# Patient Record
Sex: Female | Born: 1985 | Hispanic: Yes | Marital: Married | Smoking: Never smoker
Health system: Southern US, Community
[De-identification: ages and names within clinical notes are randomized; demographics above are authoritative.]

## PROBLEM LIST (undated history)

## (undated) DIAGNOSIS — E079 Disorder of thyroid, unspecified: Secondary | ICD-10-CM

## (undated) DIAGNOSIS — F32A Depression, unspecified: Secondary | ICD-10-CM

## (undated) DIAGNOSIS — O24419 Gestational diabetes mellitus in pregnancy, unspecified control: Secondary | ICD-10-CM

## (undated) DIAGNOSIS — F329 Major depressive disorder, single episode, unspecified: Secondary | ICD-10-CM

## (undated) DIAGNOSIS — E039 Hypothyroidism, unspecified: Secondary | ICD-10-CM

## (undated) DIAGNOSIS — F419 Anxiety disorder, unspecified: Secondary | ICD-10-CM

## (undated) DIAGNOSIS — N9489 Other specified conditions associated with female genital organs and menstrual cycle: Secondary | ICD-10-CM

## (undated) HISTORY — PX: THYROIDECTOMY: SHX17

---
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
## 2019-07-11 LAB — OB RESULTS CONSOLE HEPATITIS B SURFACE ANTIGEN: Hepatitis B Surface Ag: NEGATIVE

## 2019-07-11 LAB — OB RESULTS CONSOLE RUBELLA ANTIBODY, IGM: Rubella: IMMUNE

## 2019-07-14 ENCOUNTER — Emergency Department
Admission: EM | Admit: 2019-07-14 | Discharge: 2019-07-14 | Disposition: A | Discharge status: Home / Self Care | Payer: Medicaid Other | Attending: Emergency Medicine | Admitting: Emergency Medicine

## 2019-07-14 ENCOUNTER — Emergency Department: Payer: Medicaid Other

## 2019-07-14 ENCOUNTER — Other Ambulatory Visit: Payer: Self-pay

## 2019-07-14 ENCOUNTER — Encounter: Payer: Self-pay | Admitting: Emergency Medicine

## 2019-07-14 DIAGNOSIS — O200 Threatened abortion: Secondary | ICD-10-CM | POA: Diagnosis not present

## 2019-07-14 DIAGNOSIS — Z3A08 8 weeks gestation of pregnancy: Secondary | ICD-10-CM | POA: Insufficient documentation

## 2019-07-14 DIAGNOSIS — N760 Acute vaginitis: Secondary | ICD-10-CM | POA: Insufficient documentation

## 2019-07-14 DIAGNOSIS — B9689 Other specified bacterial agents as the cause of diseases classified elsewhere: Secondary | ICD-10-CM | POA: Diagnosis not present

## 2019-07-14 DIAGNOSIS — O26899 Other specified pregnancy related conditions, unspecified trimester: Secondary | ICD-10-CM

## 2019-07-14 DIAGNOSIS — O209 Hemorrhage in early pregnancy, unspecified: Secondary | ICD-10-CM | POA: Diagnosis present

## 2019-07-14 HISTORY — DX: Disorder of thyroid, unspecified: E07.9

## 2019-07-14 LAB — URINALYSIS, ROUTINE W REFLEX MICROSCOPIC
Bilirubin Urine: NEGATIVE
Glucose, UA: NEGATIVE mg/dL
Hgb urine dipstick: NEGATIVE
Ketones, ur: 20 mg/dL — AB
Leukocytes,Ua: NEGATIVE
Nitrite: NEGATIVE
Protein, ur: NEGATIVE mg/dL
Specific Gravity, Urine: 1.012 (ref 1.005–1.030)
pH: 6 (ref 5.0–8.0)

## 2019-07-14 LAB — WET PREP, GENITAL
Sperm: NONE SEEN
Trich, Wet Prep: NONE SEEN
Yeast Wet Prep HPF POC: NONE SEEN

## 2019-07-14 LAB — OB RESULTS CONSOLE GC/CHLAMYDIA: Gonorrhea: NEGATIVE

## 2019-07-14 LAB — CBC WITH DIFFERENTIAL/PLATELET
Abs Immature Granulocytes: 0.08 10*3/uL — ABNORMAL HIGH (ref 0.00–0.07)
Basophils Absolute: 0 10*3/uL (ref 0.0–0.1)
Basophils Relative: 0 %
Eosinophils Absolute: 0 10*3/uL (ref 0.0–0.5)
Eosinophils Relative: 0 %
HCT: 40.7 % (ref 36.0–46.0)
Hemoglobin: 14.3 g/dL (ref 12.0–15.0)
Immature Granulocytes: 1 %
Lymphocytes Relative: 15 %
Lymphs Abs: 2.2 10*3/uL (ref 0.7–4.0)
MCH: 28.8 pg (ref 26.0–34.0)
MCHC: 35.1 g/dL (ref 30.0–36.0)
MCV: 82.1 fL (ref 80.0–100.0)
Monocytes Absolute: 0.9 10*3/uL (ref 0.1–1.0)
Monocytes Relative: 6 %
Neutro Abs: 11.6 10*3/uL — ABNORMAL HIGH (ref 1.7–7.7)
Neutrophils Relative %: 78 %
Platelets: 281 10*3/uL (ref 150–400)
RBC: 4.96 MIL/uL (ref 3.87–5.11)
RDW: 13.1 % (ref 11.5–15.5)
WBC: 14.9 10*3/uL — ABNORMAL HIGH (ref 4.0–10.5)
nRBC: 0 % (ref 0.0–0.2)

## 2019-07-14 LAB — BASIC METABOLIC PANEL
Anion gap: 11 (ref 5–15)
BUN: 8 mg/dL (ref 6–20)
CO2: 22 mmol/L (ref 22–32)
Calcium: 9.7 mg/dL (ref 8.9–10.3)
Chloride: 102 mmol/L (ref 98–111)
Creatinine, Ser: 0.75 mg/dL (ref 0.44–1.00)
GFR calc Af Amer: >60 mL/min (ref >60)
GFR calc non Af Amer: >60 mL/min (ref >60)
Glucose, Bld: 95 mg/dL (ref 70–99)
Potassium: 3.5 mmol/L (ref 3.5–5.1)
Sodium: 135 mmol/L (ref 135–145)

## 2019-07-14 LAB — POCT PREGNANCY, URINE: Preg Test, Ur: POSITIVE — AB

## 2019-07-14 LAB — ABO/RH: ABO/RH(D): O POS

## 2019-07-14 LAB — HCG, QUANTITATIVE, PREGNANCY: hCG, Beta Chain, Quant, S: 151873 m[IU]/mL — ABNORMAL HIGH (ref <5)

## 2019-07-14 MED ORDER — METRONIDAZOLE 500 MG PO TABS: 500.0000 mg | ORAL_TABLET | Freq: Two times a day (BID) | ORAL | 0 refills | Status: AC

## 2019-07-14 MED ORDER — ACETAMINOPHEN 500 MG PO TABS
1000.0000 mg | ORAL_TABLET | Freq: Once | ORAL | Status: AC
  Administered 2019-07-14: 1000 mg via ORAL
  Filled 2019-07-14: qty 2

## 2019-07-14 NOTE — ED Notes (Signed)
Patient's discharge and follow up information reviewed with patient by ED nursing staff and patient given the opportunity to ask questions pertaining to ED visit and discharge plan of care. Patient advised that should symptoms not continue to improve, resolve entirely, or should new symptoms develop then a follow up visit with their PCP or a return visit to the ED may be warranted. Patient verbalized consent and understanding of discharge plan of care including potential need for further evaluation. Patient discharged in stable condition per attending ED physician on duty.   Instructions reviewed with pt via medical interpreter Claudia.

## 2019-07-14 NOTE — ED Triage Notes (Signed)
C/O lower back pain, blood in urine and lower abdominal cramping.  Patient states she is [redacted] weeks pregnant.  Has been seen through [facility] care.  EDC:02/14/2020.  G1 P0

## 2019-07-14 NOTE — Discharge Instructions (Addendum)
Your work-up was reassuring except for slightly elevated white count.  You were positive for bacterial vaginosis so we are starting you on an antibiotic for that.  Do not drink alcohol with this antibiotic.  If you develop worsening pain on your right or left side you should return to the ER.  At this time your fetus looks healthy on ultrasound.  Return to the ER for fevers, vomiting or any other concerns.   IMPRESSION: Normal-appearing single intrauterine pregnancy approximately 8 weeks 5 days gestation.

## 2019-07-14 NOTE — ED Provider Notes (Signed)
[facility] Emergency Department Provider Note  ____________________________________________   First MD Initiated Contact with Patient 07/14/19 1958     (approximate)  I have reviewed the triage vital signs and the nursing notes.   HISTORY  Chief Complaint Vaginal Bleeding    HPI Hannah Maldonado is a 33 y.o. female known pregnancy who presents with abdominal pain.  Patient states that she developed some lower abdominal cramping today.  Is mostly on the left lower quadrant.  The pain was moderate, intermittent, cramping sensation, has not taken anything to help the pain, nothing makes it worse.  Occasionally some pain in her bilateral back as well.  She states that she wiped and there was some blood on the toilet paper and a little bit of discharge so she was worried she might be having a miscarriage therefore presented to the ER.  She followed by [facility] for OB.  Denies any shortness of breath, cough, chest pain, upper abdominal pain.  No prior surgeries on her abdomen.  She has had some associated nausea but has been since pregnancy not new.          Past Medical History:  Diagnosis Date  . Thyroid disease     There are no active problems to display for this patient.   Past Surgical History:  Procedure Laterality Date  . THYROIDECTOMY      Prior to Admission medications   Not on File    Allergies Torecan [thiethylperazine]  No family history on file.  Social History Social History   Tobacco Use  . Smoking status: Never Smoker  . Smokeless tobacco: Never Used  Substance Use Topics  . Alcohol use: Not Currently  . Drug use: Never      Review of Systems Constitutional: No fever/chills Eyes: No visual changes. ENT: No sore throat. Cardiovascular: Denies chest pain. Respiratory: Denies shortness of breath. Gastrointestinal: Positive abdominal pain, nausea.  No diarrhea.  No constipation. Genitourinary: Negative for  dysuria.  Positive vaginal bleeding Musculoskeletal: Positive back pain Skin: Negative for rash. Neurological: Negative for headaches, focal weakness or numbness. All other ROS negative ____________________________________________   PHYSICAL EXAM:  VITAL SIGNS: ED Triage Vitals  Enc Vitals Group     BP 07/14/19 1732 114/84     Pulse Rate 07/14/19 1732 90     Resp 07/14/19 1732 16     Temp 07/14/19 1732 98.6 F (37 C)     Temp Source 07/14/19 1732 Oral     SpO2 07/14/19 1732 100 %     Weight 07/14/19 1733 149 lb (67.6 kg)     Height 07/14/19 1733 5\' 5"  (1.651 m)     Head Circumference --      Peak Flow --      Pain Score 07/14/19 1733 8     Pain Loc --      Pain Edu? --      Excl. in GC? --     Constitutional: Alert and oriented. Well appearing and in no acute distress. Eyes: Conjunctivae are normal. EOMI. Head: Atraumatic. Nose: No congestion/rhinnorhea. Mouth/Throat: Mucous membranes are moist.   Neck: No stridor. Trachea Midline. FROM Cardiovascular: Normal rate, regular rhythm. Grossly normal heart sounds.  Good peripheral circulation. Respiratory: Normal respiratory effort.  No retractions. Lungs CTAB. Gastrointestinal: Soft may be slight tenderness in the left lower quadrant and suprapubically.  No distention. No abdominal bruits.  No rebound.  No guarding. Musculoskeletal: No lower extremity tenderness nor edema.  No joint  effusions. Neurologic:  Normal speech and language. No gross focal neurologic deficits are appreciated.  Skin:  Skin is warm, dry and intact. No rash noted. Psychiatric: Mood and affect are normal. Speech and behavior are normal. GU: Deferred   ____________________________________________   LABS (all labs ordered are listed, but only abnormal results are displayed)  Labs Reviewed  URINALYSIS, ROUTINE W REFLEX MICROSCOPIC - Abnormal; Notable for the following components:      Result Value   Color, Urine YELLOW (*)    APPearance HAZY (*)     Ketones, ur 20 (*)    All other components within normal limits  CBC WITH DIFFERENTIAL/PLATELET - Abnormal; Notable for the following components:   WBC 14.9 (*)    Neutro Abs 11.6 (*)    Abs Immature Granulocytes 0.08 (*)    All other components within normal limits  HCG, QUANTITATIVE, PREGNANCY - Abnormal; Notable for the following components:   hCG, Beta Chain, Quant, S [id number] (*)    All other components within normal limits  POCT PREGNANCY, URINE - Abnormal; Notable for the following components:   Preg Test, Ur POSITIVE (*)    All other components within normal limits  BASIC METABOLIC PANEL  POC URINE PREG, ED  ABO/RH   ____________________________________________  RADIOLOGY   Official radiology report(s): US Ob Less Than 14 Weeks With Ob Transvaginal  Result Date: 07/14/2019 CLINICAL DATA:  Lower abdominal pain for 1 day. EXAM: OBSTETRIC <14 WK [place] AND TRANSVAGINAL OB US TECHNIQUE: Both transabdominal and transvaginal ultrasound examinations were performed for complete evaluation of the gestation as well as the maternal uterus, adnexal regions, and pelvic cul-de-sac. Transvaginal technique was performed to assess early pregnancy. COMPARISON:  None. FINDINGS: Intrauterine gestational sac: Single Yolk sac:  Yes Embryo:  Yes Cardiac Activity: Yes Heart Rate: 171 bpm CRL:  21.2 mm   8 w   5 d                  [place] EDC: 02/18/2020 Subchorionic hemorrhage:  None visualized. Maternal uterus/adnexae: 5.5 cm simple cyst in the right ovary. 1.6 cm corpus luteum cyst on the left ovary. IMPRESSION: Normal-appearing single intrauterine pregnancy approximately 8 weeks 5 days gestation. Electronically Signed   By: Francene Boyers M.D.   On: 07/14/2019 19:56    ____________________________________________   PROCEDURES  Procedure(s) performed (including Critical Care):  Procedures   ____________________________________________   INITIAL IMPRESSION / ASSESSMENT AND PLAN / ED COURSE   Hannah Maldonado was evaluated in Emergency Department on 07/14/2019 for the symptoms described in the history of present illness. She was evaluated in the context of the global COVID-19 pandemic, which necessitated consideration that the patient might be at risk for infection with the SARS-CoV-2 virus that causes COVID-19. Institutional protocols and algorithms that pertain to the evaluation of patients at risk for COVID-19 are in a state of rapid change based on information released by regulatory bodies including the CDC and federal and state organizations. These policies and algorithms were followed during the patient's care in the ED.    Patient is a very well-appearing 33 year old with her first pregnancy.  Will get ultrasound to evaluate for ectopic pregnancy.  Patient has no right lower quadrant pain to suggest appendicitis.  She says it seems be more located on the left side.  We did discuss return precautions in regards to this.  She has no blood in her urine and her back pain is not centered on one side to suggest kidney stones.  She is O+ she does not need RhoGam.  Will get urine to evaluate for UTI.  She denies new sexual contact.  Her husband is in [place].  We will send gonorrhea and Chlamydia testing but will hold off on treatment unless concerning pelvic exam.  Pregnancy test is positive with a hCG of 151,000.  UA without evidence of UTI.  White count slightly elevated at 14.9.  But again no other infectious symptoms and vitals are otherwise reassuring.  Kidney function is normal.  Ultrasound shows normal-appearing single intrauterine pregnancy at 8 weeks 5 days gestation.  Pelvic exam with the minimal amount of discharge.  No cervical motion tenderness.  No obvious blood.  Patient's wet prep is positive for clue cells.  Will start on a course of Flagyl for bacterial vaginosis given the discharge and some lower abdominal pain.  I discussed the provisional nature of ED diagnosis, the treatment  so far, the ongoing plan of care, follow up appointments and return precautions with the patient and any family or support people present. They expressed understanding and agreed with the plan, discharged home.       ____________________________________________   FINAL CLINICAL IMPRESSION(S) / ED DIAGNOSES   Final diagnoses:  Threatened miscarriage  BV (bacterial vaginosis)      MEDICATIONS GIVEN DURING THIS VISIT:  Medications  acetaminophen (TYLENOL) tablet 1,000 mg (1,000 mg Oral Given 07/14/19 2034)     ED Discharge Orders         Ordered    metroNIDAZOLE (FLAGYL) 500 MG tablet  2 times daily     07/14/19 2119           Note:  This document was prepared using Dragon voice recognition software and may include unintentional dictation errors.   Vanessa [place], MD 07/14/19 2121

## 2019-07-18 LAB — GC/CHLAMYDIA PROBE AMP
Chlamydia trachomatis, NAA: NEGATIVE
Neisseria Gonorrhoeae by PCR: NEGATIVE

## 2019-08-22 NOTE — L&D Delivery Note (Signed)
Delivery Note  First Stage: Labor onset: 2230 Induction: cytotec, cook catheter, Pitocin, AROM Analgesia /Anesthesia intrapartum: stadol, epidural AROM at 0137  Second Stage: Complete dilation at 0415 Onset of pushing at 0426 FHR second stage Cat II  Delivery of a viable female 02/27/20 at  0512 by CNM delivery of fetal head in LOA position with restitution to LOT. Tight nuchal cord x 1;  Anterior then posterior shoulders delivered easily with gentle downward traction. Baby placed on mom's chest, and attended to by peds.  Cord double clamped after cessation of pulsation, cut by CNM Cord blood sample collected   Third Stage: Placenta delivered spontaneously intact with 3VC @ 0516 Placenta disposition: routine disposal Uterine tone firm / bleeding scant  2nd deg laceration identified  Anesthesia for repair: epidural Repair: 2-0 Vicryl Ct-1, 3-0 Vicryl SH Est. Blood Loss (mL): 300  Complications: none  Mom to postpartum.  Baby to Couplet care / Skin to Skin.  Newborn: Birth Weight: pending  Apgar Scores: 8/9 Feeding planned: breast

## 2019-11-04 ENCOUNTER — Other Ambulatory Visit: Payer: Self-pay | Admitting: Physician Assistant

## 2019-11-04 DIAGNOSIS — Z3490 Encounter for supervision of normal pregnancy, unspecified, unspecified trimester: Secondary | ICD-10-CM

## 2019-11-10 ENCOUNTER — Ambulatory Visit
Admission: RE | Admit: 2019-11-10 | Discharge: 2019-11-10 | Disposition: A | Discharge status: Home / Self Care | Payer: Medicaid Other | Source: Ambulatory Visit | Attending: Physician Assistant | Admitting: Physician Assistant

## 2019-11-10 ENCOUNTER — Other Ambulatory Visit: Payer: Self-pay

## 2019-11-10 DIAGNOSIS — Z3490 Encounter for supervision of normal pregnancy, unspecified, unspecified trimester: Secondary | ICD-10-CM | POA: Insufficient documentation

## 2019-11-26 LAB — OB RESULTS CONSOLE RPR: RPR: NONREACTIVE

## 2019-11-26 LAB — OB RESULTS CONSOLE HIV ANTIBODY (ROUTINE TESTING): HIV: NONREACTIVE

## 2020-01-23 LAB — OB RESULTS CONSOLE GC/CHLAMYDIA
Chlamydia: NEGATIVE
Gonorrhea: NEGATIVE

## 2020-01-24 LAB — OB RESULTS CONSOLE GBS: GBS: POSITIVE

## 2020-02-17 ENCOUNTER — Encounter: Payer: Self-pay | Admitting: Certified Nurse Midwife

## 2020-02-17 ENCOUNTER — Other Ambulatory Visit: Payer: Self-pay | Admitting: Certified Nurse Midwife

## 2020-02-17 NOTE — Progress Notes (Signed)
  Malene Blaydes is a 34 y.o. G1P0 female dated by LMP c/w [redacted]w[redacted]d ultrasound.    Pregnancy Issues: 1. Postsurgical hypothyroidism, currently taking daily, advised to decrease to daily postpartum per [facility] Endocrinology 2. History of anxiety and depression, on Paxil prior to pregnancy  [facility]  [facility]   Prenatal Labs: Blood type/Rh O+  Antibody screen neg  Rubella Immune  Varicella pending  RPR NR  HBsAg Neg  HIV NR  GC neg  Chlamydia neg  Genetic screening Not done  1 hour GTT 120  3 hour GTT n/a  GBS positive    Post Partum Planning: - Infant feeding: breast - Contraception: abstinence (husband in [place])  Tdap: 11/25/2019 Flu: 06/21/2019

## 2020-02-26 ENCOUNTER — Inpatient Hospital Stay: Payer: Medicaid Other | Admitting: Anesthesiology

## 2020-02-26 ENCOUNTER — Inpatient Hospital Stay
Admission: EM | Admit: 2020-02-26 | Discharge: 2020-02-28 | DRG: 807 | Disposition: A | Discharge status: Home / Self Care | Payer: Medicaid Other | Attending: Obstetrics and Gynecology | Admitting: Obstetrics and Gynecology

## 2020-02-26 ENCOUNTER — Other Ambulatory Visit: Payer: Self-pay

## 2020-02-26 ENCOUNTER — Encounter: Payer: Self-pay | Admitting: Obstetrics and Gynecology

## 2020-02-26 DIAGNOSIS — Z3A41 41 weeks gestation of pregnancy: Secondary | ICD-10-CM

## 2020-02-26 DIAGNOSIS — O480 Post-term pregnancy: Secondary | ICD-10-CM | POA: Diagnosis present

## 2020-02-26 DIAGNOSIS — E890 Postprocedural hypothyroidism: Secondary | ICD-10-CM | POA: Diagnosis present

## 2020-02-26 DIAGNOSIS — Z20822 Contact with and (suspected) exposure to covid-19: Secondary | ICD-10-CM | POA: Diagnosis present

## 2020-02-26 DIAGNOSIS — O99284 Endocrine, nutritional and metabolic diseases complicating childbirth: Secondary | ICD-10-CM | POA: Diagnosis present

## 2020-02-26 HISTORY — DX: Depression, unspecified: F32.A

## 2020-02-26 HISTORY — DX: Anxiety disorder, unspecified: F41.9

## 2020-02-26 HISTORY — DX: Hypothyroidism, unspecified: E03.9

## 2020-02-26 LAB — CBC
HCT: 40 % (ref 36.0–46.0)
Hemoglobin: 13.8 g/dL (ref 12.0–15.0)
MCH: 30.9 pg (ref 26.0–34.0)
MCHC: 34.5 g/dL (ref 30.0–36.0)
MCV: 89.7 fL (ref 80.0–100.0)
Platelets: 189 10*3/uL (ref 150–400)
RBC: 4.46 MIL/uL (ref 3.87–5.11)
RDW: 13.3 % (ref 11.5–15.5)
WBC: 8.5 10*3/uL (ref 4.0–10.5)
nRBC: 0 % (ref 0.0–0.2)

## 2020-02-26 LAB — TYPE AND SCREEN
ABO/RH(D): O POS
Antibody Screen: NEGATIVE

## 2020-02-26 LAB — RPR: RPR Ser Ql: NONREACTIVE

## 2020-02-26 LAB — SARS CORONAVIRUS 2 BY RT PCR (HOSPITAL ORDER, PERFORMED IN ~~LOC~~ HOSPITAL LAB): SARS Coronavirus 2: NEGATIVE

## 2020-02-26 MED ORDER — OXYTOCIN-SODIUM CHLORIDE 30-0.9 UT/500ML-% IV SOLN
1.0000 m[IU]/min | INTRAVENOUS | Status: DC
  Administered 2020-02-26: 2 m[IU]/min via INTRAVENOUS
  Filled 2020-02-26 (×2): qty 500

## 2020-02-26 MED ORDER — LACTATED RINGERS IV SOLN: 500.0000 mL | INTRAVENOUS | Status: DC | PRN

## 2020-02-26 MED ORDER — SODIUM CHLORIDE 0.9 % IV SOLN
5.0000 10*6.[IU] | Freq: Once | INTRAVENOUS | Status: AC
  Administered 2020-02-26: 5 10*6.[IU] via INTRAVENOUS
  Filled 2020-02-26: qty 5

## 2020-02-26 MED ORDER — MISOPROSTOL 25 MCG QUARTER TABLET
25.0000 ug | ORAL_TABLET | ORAL | Status: DC | PRN
  Administered 2020-02-26: 25 ug via VAGINAL
  Filled 2020-02-26 (×2): qty 1

## 2020-02-26 MED ORDER — BUTORPHANOL TARTRATE 1 MG/ML IJ SOLN
1.0000 mg | INTRAMUSCULAR | Status: DC | PRN
  Administered 2020-02-26: 1 mg via INTRAVENOUS
  Filled 2020-02-26: qty 1

## 2020-02-26 MED ORDER — SOD CITRATE-CITRIC ACID 500-334 MG/5ML PO SOLN: 30.0000 mL | ORAL | Status: DC | PRN

## 2020-02-26 MED ORDER — TERBUTALINE SULFATE 1 MG/ML IJ SOLN: 0.2500 mg | Freq: Once | INTRAMUSCULAR | Status: DC | PRN

## 2020-02-26 MED ORDER — LIDOCAINE-EPINEPHRINE (PF) 1.5 %-1:200000 IJ SOLN
INTRAMUSCULAR | Status: DC | PRN
  Administered 2020-02-26: 3 mL via EPIDURAL

## 2020-02-26 MED ORDER — LACTATED RINGERS IV SOLN: INTRAVENOUS | Status: DC

## 2020-02-26 MED ORDER — OXYTOCIN-SODIUM CHLORIDE 30-0.9 UT/500ML-% IV SOLN
2.5000 [IU]/h | INTRAVENOUS | Status: DC
  Administered 2020-02-27: 2.5 [IU]/h via INTRAVENOUS

## 2020-02-26 MED ORDER — PENICILLIN G POT IN DEXTROSE 60000 UNIT/ML IV SOLN
3.0000 10*6.[IU] | INTRAVENOUS | Status: DC
  Administered 2020-02-26 – 2020-02-27 (×5): 3 10*6.[IU] via INTRAVENOUS
  Filled 2020-02-26 (×5): qty 50

## 2020-02-26 MED ORDER — ACETAMINOPHEN 325 MG PO TABS: 650.0000 mg | ORAL_TABLET | ORAL | Status: DC | PRN

## 2020-02-26 MED ORDER — AMMONIA AROMATIC IN INHA
RESPIRATORY_TRACT | Status: AC
  Filled 2020-02-26: qty 10

## 2020-02-26 MED ORDER — ONDANSETRON HCL 4 MG/2ML IJ SOLN
4.0000 mg | Freq: Four times a day (QID) | INTRAMUSCULAR | Status: DC | PRN
  Administered 2020-02-26: 4 mg via INTRAVENOUS
  Filled 2020-02-26: qty 2

## 2020-02-26 MED ORDER — LIDOCAINE HCL (PF) 1 % IJ SOLN
INTRAMUSCULAR | Status: DC | PRN
  Administered 2020-02-26: 3 mL via SUBCUTANEOUS

## 2020-02-26 MED ORDER — MISOPROSTOL 25 MCG QUARTER TABLET
25.0000 ug | ORAL_TABLET | ORAL | Status: DC | PRN
  Administered 2020-02-26 (×2): 25 ug via BUCCAL
  Filled 2020-02-26 (×2): qty 1

## 2020-02-26 MED ORDER — FENTANYL 2.5 MCG/ML W/ROPIVACAINE 0.15% IN NS 100 ML EPIDURAL (ARMC)
EPIDURAL | Status: DC | PRN
  Administered 2020-02-26: 12 mL/h via EPIDURAL

## 2020-02-26 MED ORDER — MISOPROSTOL 200 MCG PO TABS
ORAL_TABLET | ORAL | Status: AC
  Filled 2020-02-26: qty 4

## 2020-02-26 MED ORDER — LIDOCAINE HCL (PF) 1 % IJ SOLN
30.0000 mL | INTRAMUSCULAR | Status: DC | PRN
  Filled 2020-02-26: qty 30

## 2020-02-26 MED ORDER — FENTANYL 2.5 MCG/ML W/ROPIVACAINE 0.15% IN NS 100 ML EPIDURAL (ARMC)
EPIDURAL | Status: AC
  Filled 2020-02-26: qty 100

## 2020-02-26 MED ORDER — OXYTOCIN 10 UNIT/ML IJ SOLN
INTRAMUSCULAR | Status: AC
  Filled 2020-02-26: qty 2

## 2020-02-26 MED ORDER — OXYTOCIN BOLUS FROM INFUSION
333.0000 mL | Freq: Once | INTRAVENOUS | Status: AC
  Administered 2020-02-27: 333 mL via INTRAVENOUS

## 2020-02-26 NOTE — Progress Notes (Signed)
Labor Progress Note  Hannah Maldonado is a 34 y.o. G1P0 at [redacted]w[redacted]d by LMP admitted for induction of labor due to Post dates. Due date: 02/17/20.  Subjective: feeling more painful UCs and cook cath fell out while up to bathroom.   Objective: BP (!) 110/56 (BP Location: Left Arm)   Pulse 76   Temp 98.1 F (36.7 C) (Oral)   Resp 18   Ht 5\' 2"  (1.575 m)   Wt 75.3 kg   LMP 05/13/2019   BMI 30.36 kg/m  Notable VS details: reviewed  Fetal Assessment: FHT:  FHR: 140 bpm, variability: moderate,  accelerations:  Present,  decelerations:  Absent Category/reactivity:  Category I UC:   regular, every 2-3 minutes, Pitocin at 43mu/min SVE:  6/90/-2 BBOW  Membrane status: intact Amniotic color: n/a  Labs: Lab Results  Component Value Date   WBC 8.5 02/26/2020   HGB 13.8 02/26/2020   HCT 40.0 02/26/2020   MCV 89.7 02/26/2020   PLT 189 02/26/2020    Assessment / Plan: IOL at 41.2wks for late term.   Labor: s/p 2 doses of cytotec, and Cook cath placed, on Pitocin.  Preeclampsia:  no e/o pre-e Fetal Wellbeing:  Category I Pain Control:  s/p stadol, now requesting epidural.  I/D:  GBS prophy with PCN, adequately tx.  Anticipated MOD:  NSVD  04/28/2020, CNM 02/26/2020, 11:04 PM

## 2020-02-26 NOTE — Anesthesia Preprocedure Evaluation (Signed)
Anesthesia Evaluation  Patient identified by MRN, date of birth, ID band Patient awake    Reviewed: Allergy & Precautions, H&P , NPO status , Patient's Chart, lab work & pertinent test results  History of Anesthesia Complications Negative for: history of anesthetic complications  Airway Mallampati: II       Dental no notable dental hx.    Pulmonary    Pulmonary exam normal        Cardiovascular Normal cardiovascular exam     Neuro/Psych    GI/Hepatic   Endo/Other  Hypothyroidism   Renal/GU      Musculoskeletal   Abdominal   Peds  Hematology   Anesthesia Other Findings   Reproductive/Obstetrics (+) Pregnancy                             Anesthesia Physical Anesthesia Plan  ASA: II  Anesthesia Plan: Epidural   Post-op Pain Management:    Induction:   PONV Risk Score and Plan:   Airway Management Planned:   Additional Equipment:   Intra-op Plan:   Post-operative Plan:   Informed Consent: I have reviewed the patients History and Physical, chart, labs and discussed the procedure including the risks, benefits and alternatives for the proposed anesthesia with the patient or authorized representative who has indicated his/her understanding and acceptance.       Plan Discussed with:   Anesthesia Plan Comments:         Anesthesia Quick Evaluation

## 2020-02-26 NOTE — Progress Notes (Signed)
Labor Progress Note  Hannah Maldonado is a 34 y.o. G1P0 at [redacted]w[redacted]d by LMP admitted for induction of labor due to Post dates. Due date: 02/17/20.  Subjective: feeling more painful UCs  Objective: BP 112/63 (BP Location: Left Arm)   Pulse 70   Temp 97.9 F (36.6 C) (Oral)   Resp 15   Ht 5\' 2"  (1.575 m)   Wt 75.3 kg   LMP 05/13/2019   BMI 30.36 kg/m  Notable VS details: reviewed  Fetal Assessment: FHT:  FHR: 135 bpm, variability: moderate,  accelerations:  Present,  decelerations:  Absent Category/reactivity:  Category I UC:   regular, every 2-4 minutes SVE:   1.5/75/-2, soft/anterior.  Cook catheter placed, uterine balloon 66ml/vaginal balloon 2ml  Membrane status: intact Amniotic color: n/a  Labs: Lab Results  Component Value Date   WBC 8.5 02/26/2020   HGB 13.8 02/26/2020   HCT 40.0 02/26/2020   MCV 89.7 02/26/2020   PLT 189 02/26/2020    Assessment / Plan: IOL at 41.2wks for late term.   Labor: s/p 2 doses of cytotec, now Winters cath placed, will start Pitocin.  Preeclampsia:  no e/o pre-e Fetal Wellbeing:  Category I Pain Control:  Labor support without medications I/D:  GBS prophy with PCN, starting 4th dose now.  Anticipated MOD:  NSVD  Luceni, CNM 02/26/2020, 6:12 PM

## 2020-02-26 NOTE — Anesthesia Procedure Notes (Signed)
Epidural Patient location during procedure: OB Start time: 02/26/2020 11:23 AM End time: 02/26/2020 11:26 PM  Staffing Anesthesiologist: Naomie Dean, MD Performed: anesthesiologist   Preanesthetic Checklist Completed: patient identified, IV checked, site marked, risks and benefits discussed, surgical consent, monitors and equipment checked, pre-op evaluation and timeout performed  Epidural Patient position: sitting Prep: ChloraPrep Patient monitoring: heart rate, continuous pulse ox and blood pressure Approach: midline Location: L3-L4 Injection technique: LOR saline  Needle:  Needle type: Tuohy  Needle gauge: 17 G Needle length: 9 cm and 9 Needle insertion depth: 7 cm Catheter type: closed end flexible Catheter size: 19 Gauge Catheter at skin depth: 13 cm Test dose: negative and 1.5% lidocaine with Epi 1:200 K  Assessment Sensory level: T10 Events: blood not aspirated, injection not painful, no injection resistance, no paresthesia and negative IV test  Additional Notes Pt's history reviewed and consent obtained as per OB consent Patient tolerated the insertion well without complications. Negative SATD, negative IVTD All VSS were obtained and monitored through OBIX and nursing protocols followed.Reason for block:procedure for pain

## 2020-02-26 NOTE — H&P (Signed)
OB History & Physical   History of Present Illness:  Chief Complaint: induction of labor  HPI:  Hannah Maldonado is a 35 y.o. G1P0 female at [redacted]w[redacted]d dated by LMP and c/w [place] at [redacted]w[redacted]d..  She presents to L&D for late term IOL.   On arrival reported active FM, has had onset of ctx since Cytotec dose given at 0628 this am. Denies SROM or bloody show.   Pregnancy Issues: 1. Post-surgical hypothyroidism, currently taking daily, advised to decrease to daily postpartum per [facility] Endocrinology 2. History of anxiety and depression, on Paxil prior to pregnancy   Maternal Medical History:   Past Medical History:  Diagnosis Date  . Anxiety   . Depression   . Hypothyroidism   . Thyroid disease     Past Surgical History:  Procedure Laterality Date  . THYROIDECTOMY      Allergies  Allergen Reactions  . Torecan [Thiethylperazine] Other (See Comments)    Muscle spasms    Prior to Admission medications   Medication Sig Start Date End Date Taking? Authorizing Provider  levothyroxine (SYNTHROID) 150 MCG tablet Take 150 mcg by mouth daily before breakfast.   Yes [provider]  Prenatal Vit-Fe Fumarate-FA (PRENATAL MULTIVITAMIN) TABS tablet Take 1 tablet by mouth daily at 12 noon.   Yes [provider]     [facility] care site: [facility]  Social History: She  reports that she has never smoked. She has never used smokeless tobacco. She reports previous alcohol use. She reports that she does not use drugs.  Family History: no family hx Gyn cancers  Review of Systems: A full review of systems was performed and negative except as noted in the HPI.     Physical Exam:  Vital Signs: BP 111/76 (BP Location: Right Arm)   Pulse 76   Temp 97.9 F (36.6 C) (Oral)   Resp 16   Ht 5\' 2"  (1.575 m)   Wt 75.3 kg   LMP 05/13/2019   BMI 30.36 kg/m   General: no acute distress.  HEENT: normocephalic, atraumatic Heart: regular rate & rhythm.  No  murmurs/rubs/gallops Lungs: clear to auscultation bilaterally, normal respiratory effort Abdomen: soft, gravid, non-tender;  EFW: 7.5lbs Pelvic:   External: Normal external female genitalia  Cervix: Dilation: Fingertip / Effacement (%): Thick / Station: -3    Extremities: non-tender, symmetric, no edema bilaterally.  DTRs: 2+  Neurologic: Alert & oriented x 3.    Results for orders placed or performed during the hospital encounter of 02/26/20 (from the past 24 hour(s))  CBC     Status: None   Collection Time: 02/26/20  4:54 AM  Result Value Ref Range   WBC 8.5 4.0 - 10.5 K/uL   RBC 4.46 3.87 - 5.11 MIL/uL   Hemoglobin 13.8 12.0 - 15.0 g/dL   HCT 04/28/20 36 - 46 %   MCV 89.7 80.0 - 100.0 fL   MCH 30.9 26.0 - 34.0 pg   MCHC 34.5 30.0 - 36.0 g/dL   RDW 69.6 78.9 - 38.1 %   Platelets 189 150 - 400 K/uL   nRBC 0.0 0.0 - 0.2 %  Type and screen     Status: None   Collection Time: 02/26/20  4:54 AM  Result Value Ref Range   ABO/RH(D) O POS    Antibody Screen NEG    Sample Expiration      02/29/2020,2359 Performed at [facility], [address] [place], [place]   SARS Coronavirus  2 by RT PCR (hospital order, performed in [facility] hospital lab) Nasopharyngeal Nasopharyngeal Swab     Status: None   Collection Time: 02/26/20  5:23 AM   Specimen: Nasopharyngeal Swab  Result Value Ref Range   SARS Coronavirus 2 NEGATIVE NEGATIVE    Pertinent Results:  Prenatal Labs: Blood type/Rh O pos  Antibody screen neg  Rubella Immune  Varicella  pending  RPR NR  HBsAg Neg  HIV NR  GC neg  Chlamydia neg  Genetic screening  not done  1 hour GTT  120  3 hour GTT  n/a  GBS  Positive   FHT: 140bpm, mod variability, + accels, no decels TOCO: irreg UCs, palpate mild SVE:  Dilation: Fingertip / Effacement (%): Thick / Station: -3    Cephalic by leopolds  No results found.  Assessment:  Hannah Maldonado is a 33 y.o. G1P0 female at [redacted]w[redacted]d with Late term IOL.    Plan:  1. Admit to Labor & Delivery; consents reviewed and obtained - COVID neg on admit.   2. Fetal Well being  - Fetal Tracing: Cat I - Group B Streptococcus ppx indicated: Positive, PCN 1st dose given.  - Presentation: cephalic confirmed by Leopolds and bedside US  3. Routine OB: - Prenatal labs reviewed, as above - Rh O pos - CBC, T&S, RPR on admit - Clear fluids, IVF  4. Induction of Labor -  Contractions: external toco in place -  Pelvis unproven, adequate -  Plan for induction with cytotec -  Plan for continuous fetal monitoring  -  Maternal pain control as desired - Anticipate vaginal delivery  5. Post Partum Planning: - Infant feeding: breast - Contraception: abstinence (husband in [place]) Tdap: 11/25/2019 Flu: 06/21/2019  Prudencio Pair Charisse Wendell, CNM 02/26/20 8:13 AM

## 2020-02-27 ENCOUNTER — Encounter: Payer: Self-pay | Admitting: Obstetrics and Gynecology

## 2020-02-27 LAB — VARICELLA ZOSTER ANTIBODY, IGG: Varicella IgG: <135 index — ABNORMAL LOW (ref >165)

## 2020-02-27 MED ORDER — PRENATAL MULTIVITAMIN CH
1.0000 | ORAL_TABLET | Freq: Every day | ORAL | Status: DC
  Administered 2020-02-27 – 2020-02-28 (×2): 1 via ORAL
  Filled 2020-02-27 (×2): qty 1

## 2020-02-27 MED ORDER — IBUPROFEN 600 MG PO TABS
600.0000 mg | ORAL_TABLET | Freq: Four times a day (QID) | ORAL | Status: DC
  Administered 2020-02-27: 600 mg via ORAL
  Filled 2020-02-27: qty 1

## 2020-02-27 MED ORDER — ONDANSETRON HCL 4 MG/2ML IJ SOLN: 4.0000 mg | INTRAMUSCULAR | Status: DC | PRN

## 2020-02-27 MED ORDER — PHENYLEPHRINE 40 MCG/ML (10ML) SYRINGE FOR IV PUSH (FOR BLOOD PRESSURE SUPPORT): 80.0000 ug | PREFILLED_SYRINGE | INTRAVENOUS | Status: DC | PRN

## 2020-02-27 MED ORDER — FENTANYL 2.5 MCG/ML W/ROPIVACAINE 0.15% IN NS 100 ML EPIDURAL (ARMC): 12.0000 mL/h | EPIDURAL | Status: DC

## 2020-02-27 MED ORDER — DIPHENHYDRAMINE HCL 25 MG PO CAPS: 25.0000 mg | ORAL_CAPSULE | Freq: Four times a day (QID) | ORAL | Status: DC | PRN

## 2020-02-27 MED ORDER — EPHEDRINE 5 MG/ML INJ
INTRAVENOUS | Status: AC
  Filled 2020-02-27: qty 4

## 2020-02-27 MED ORDER — LACTATED RINGERS IV SOLN
500.0000 mL | Freq: Once | INTRAVENOUS | Status: AC
  Administered 2020-02-26: 500 mL via INTRAVENOUS

## 2020-02-27 MED ORDER — ONDANSETRON HCL 4 MG PO TABS: 4.0000 mg | ORAL_TABLET | ORAL | Status: DC | PRN

## 2020-02-27 MED ORDER — COCONUT OIL OIL
1.0000 "application " | TOPICAL_OIL | Status: DC | PRN
  Administered 2020-02-27 – 2020-02-28 (×2): 1 via TOPICAL
  Filled 2020-02-27 (×2): qty 120

## 2020-02-27 MED ORDER — BENZOCAINE-MENTHOL 20-0.5 % EX AERO
1.0000 "application " | INHALATION_SPRAY | CUTANEOUS | Status: DC | PRN
  Administered 2020-02-28: 1 via TOPICAL
  Filled 2020-02-27 (×2): qty 56

## 2020-02-27 MED ORDER — ZOLPIDEM TARTRATE 5 MG PO TABS: 5.0000 mg | ORAL_TABLET | Freq: Every evening | ORAL | Status: DC | PRN

## 2020-02-27 MED ORDER — ACETAMINOPHEN 325 MG PO TABS
650.0000 mg | ORAL_TABLET | ORAL | Status: DC | PRN
  Administered 2020-02-27 – 2020-02-28 (×6): 650 mg via ORAL
  Filled 2020-02-27 (×6): qty 2

## 2020-02-27 MED ORDER — EPHEDRINE 5 MG/ML INJ
10.0000 mg | INTRAVENOUS | Status: DC | PRN
  Administered 2020-02-27: 10 mg via INTRAVENOUS

## 2020-02-27 MED ORDER — SENNOSIDES-DOCUSATE SODIUM 8.6-50 MG PO TABS
2.0000 | ORAL_TABLET | ORAL | Status: DC
  Administered 2020-02-28: 2 via ORAL
  Filled 2020-02-27: qty 2

## 2020-02-27 MED ORDER — DIBUCAINE (PERIANAL) 1 % EX OINT: 1.0000 "application " | TOPICAL_OINTMENT | CUTANEOUS | Status: DC | PRN

## 2020-02-27 MED ORDER — LEVOTHYROXINE SODIUM 125 MCG PO TABS
125.0000 ug | ORAL_TABLET | Freq: Every day | ORAL | Status: DC
  Administered 2020-02-27 – 2020-02-28 (×2): 125 ug via ORAL
  Filled 2020-02-27: qty 1

## 2020-02-27 MED ORDER — SIMETHICONE 80 MG PO CHEW: 80.0000 mg | CHEWABLE_TABLET | ORAL | Status: DC | PRN

## 2020-02-27 MED ORDER — DIPHENHYDRAMINE HCL 50 MG/ML IJ SOLN: 12.5000 mg | INTRAMUSCULAR | Status: DC | PRN

## 2020-02-27 MED ORDER — IBUPROFEN 600 MG PO TABS
600.0000 mg | ORAL_TABLET | Freq: Four times a day (QID) | ORAL | Status: DC
  Administered 2020-02-27 – 2020-02-28 (×4): 600 mg via ORAL
  Filled 2020-02-27 (×4): qty 1

## 2020-02-27 MED ORDER — LEVOTHYROXINE SODIUM 150 MCG PO TABS
150.0000 ug | ORAL_TABLET | Freq: Every day | ORAL | Status: DC
  Filled 2020-02-27: qty 1

## 2020-02-27 MED ORDER — WITCH HAZEL-GLYCERIN EX PADS
1.0000 "application " | MEDICATED_PAD | CUTANEOUS | Status: DC | PRN
  Administered 2020-02-28: 1 via TOPICAL
  Filled 2020-02-27 (×2): qty 100

## 2020-02-27 NOTE — Progress Notes (Signed)
Post Partum Day 0  Subjective: no complaints, up ad lib, voiding, tolerating PO and + flatus  Doing well, no concerns. Ambulating without difficulty, pain managed with PO meds, tolerating regular diet, and voiding without difficulty.   No fever/chills, chest pain, shortness of breath, nausea/vomiting, or leg pain. No nipple or breast pain. No headache, visual changes, or RUQ/epigastric pain.  Objective: BP 103/60 (BP Location: Left Arm)   Pulse 76   Temp 97.8 F (36.6 C) (Oral)   Resp 18   Ht 5\' 2"  (1.575 m)   Wt 75.3 kg   LMP 05/13/2019   SpO2 99% Comment: Room Air  Breastfeeding Unknown   BMI 30.36 kg/m    Physical Exam:  General: alert, cooperative and no distress Breasts: soft/nontender CV: RRR Pulm: nl effort, CTABL Abdomen: soft, non-tender, active bowel sounds Uterine Fundus: firm Perineum: minimal edema, repair well approximated Lochia: appropriate DVT Evaluation: No evidence of DVT seen on physical exam.  Recent Labs    02/26/20 0454  HGB 13.8  HCT 40.0  WBC 8.5  PLT 189    Assessment/Plan: 34 y.o. G1P1001 postpartum day # 0  -Continue routine postpartum care -Lactation consult PRN for breastfeeding   -Discussed contraceptive options including implant, IUDs hormonal and non-hormonal, injection, pills/ring/patch, condoms, and NFP.  -CBC in AM -Immunization status: all immunizations up to date  Disposition: Continue inpatient postpartum care    LOS: 1 day   34, CNM 02/27/2020, 6:39 PM   ----- 04/29/2020  Certified Nurse Midwife [facility]

## 2020-02-27 NOTE — Discharge Summary (Signed)
Obstetrical Discharge Summary  Patient Name: Hannah Maldonado DOB: 1985-10-06 MRN: [id number]  Date of Admission: 02/26/2020 Date of Delivery: 02/27/20 Delivered by: Heloise Ochoa CNM Date of Discharge: 02/28/2020  Primary OB: Phineas Real  [id number] last menstrual period was 05/13/2019. EDC Estimated Date of Delivery: 02/17/20 Gestational Age at Delivery: [redacted]w[redacted]d   Antepartum complications: 1.Post-surgical hypothyroidism, currently taking daily, advised to decrease to daily postpartum per [facility] Endocr[facility] 2. History of anxiety and depression, on Paxil prior to pregnancy  Admitting Diagnosis: late term pregnancy Secondary Diagnosis: SVD, nuchal cord, meconium stained fluid, 2nd deg laceration  Patient Active Problem List   Diagnosis Date Noted  . Labor and delivery indication for care or intervention 02/26/2020    Augmentation: AROM, Pitocin, Cytotec and IP Foley Complications: None Intrapartum complications/course: see delivery note Date of Delivery: 02/27/20 Delivered By: Heloise Ochoa CNM Delivery Type: spontaneous vaginal delivery Anesthesia: epidural Placenta: spontaneous Laceration: 2nd deg laceration with repair, periclitoral Episiotomy: none Newborn Data: Live born female  Birth Weight:   APGAR: 8,   Newborn Delivery   Birth date/time: 02/27/2020 05:12:00 Delivery type: Vaginal, Spontaneous      Postpartum Procedures: none  Edinburgh:  Edinburgh Postnatal Depression Scale Screening Tool 02/27/2020 02/27/2020  I have been able to laugh and see the funny side of things. 0 (No Data)  I have looked forward with enjoyment to things. 0 -  I have blamed myself unnecessarily when things went wrong. 0 -  I have been anxious or worried for no good reason. 0 -  I have felt scared or panicky for no good reason. 0 -  Things have been getting on top of me. 1 -  I have been so unhappy that I have had difficulty sleeping. 0 -  I have felt sad or miserable.  0 -  I have been so unhappy that I have been crying. 0 -  The thought of harming myself has occurred to me. 0 -  Edinburgh Postnatal Depression Scale Total 1 -      Post partum course:  Patient had an uncomplicated postpartum course.  By time of discharge on PPD#1, her pain was controlled on oral pain medications; she had appropriate lochia and was ambulating, voiding without difficulty and tolerating regular diet.  She was deemed stable for discharge to home.     Discharge Physical Exam:  BP 104/76 (BP Location: Left Arm)   Pulse 79   Temp 98.1 F (36.7 C) (Oral)   Resp 18   Ht 5\' 2"  (1.575 m)   Wt 75.3 kg   LMP 05/13/2019   SpO2 99%   Breastfeeding Unknown   BMI 30.36 kg/m   General: NAD CV: RRR Pulm: CTABL, nl effort ABD: s/nd/nt, fundus firm and below the umbilicus Lochia: moderate Perineum: minimal edema, repair well approximated DVT Evaluation: LE non-ttp, no evidence of DVT on exam.  Hemoglobin  Date Value Ref Range Status  02/28/2020 11.5 (L) 12.0 - 15.0 g/dL Final   HCT  Date Value Ref Range Status  02/28/2020 32.6 (L) 36 - 46 % Final     Disposition: stable, discharge to home. Baby Feeding: breastmilk Baby Disposition: home with mom  Rh Immune globulin given: n/a Rubella vaccine given: immune Varicella vaccine given: needs prior to DC Tdap vaccine given in AP or PP setting: 11/25/19 Flu vaccine given in AP or PP setting: 06/21/19  Contraception: abstinence, spouse in 06/23/19  Prenatal Labs:  Blood type/Rh O pos  Antibody screen neg  Rubella  Immune  Varicella  NON-immune  RPR NR  HBsAg Neg  HIV NR  GC neg  Chlamydia neg  Genetic screening  not done  1 hour GTT  120  3 hour GTT  n/a  GBS  Positive      Plan:  Kiyoko Trujillo-Gonzalez was discharged to home in good condition. Follow-up appointment with delivering provider in 4 weeks.  Discharge Medications: Allergies as of 02/28/2020      Reactions   Torecan [thiethylperazine] Other  (See Comments)   Muscle spasms      Medication List    TAKE these medications   acetaminophen 325 MG tablet Commonly known as: Tylenol Take 2 tablets (650 mg total) by mouth every 4 (four) hours as needed (for pain scale < 4).   benzocaine-Menthol 20-0.5 % Aero Commonly known as: DERMOPLAST Apply 1 application topically as needed for irritation (perineal discomfort).   coconut oil Oil Apply 1 application topically as needed.   ibuprofen 600 MG tablet Commonly known as: ADVIL Take 1 tablet (600 mg total) by mouth every 6 (six) hours as needed for mild pain or moderate pain.   levothyroxine 150 MCG tablet Commonly known as: SYNTHROID Take 150 mcg by mouth daily before breakfast.   prenatal multivitamin Tabs tablet Take 1 tablet by mouth daily at 12 noon.   senna-docusate 8.6-50 MG tablet Commonly known as: Senokot-S Take 2 tablets by mouth daily as needed for mild constipation.   varicella virus vaccine live 1350 PFU/0.5ML Inj injection Commonly known as: VARIVAX Inject 0.5 mLs into the skin once for 1 dose.        Follow-up Information    McVey, Prudencio Pair, CNM. Go on 03/26/2020.   Specialty: Obstetrics and Gynecology Why: Postpartum visit: Friday, August 6th at 2:45pm with Heloise Ochoa, CNM at [facility]!  Contact information: [address] [place] [address] [phone]               Signed:  Gustavo Lah, CNM 02/28/2020  11:11 AM  Margaretmary Eddy, CNM Certified Nurse Midwife [place]  [facility]

## 2020-02-27 NOTE — Progress Notes (Signed)
Labor Progress Note  Hannah Maldonado is a 34 y.o. G1P0 at [redacted]w[redacted]d by LMP admitted for induction of labor due to Post dates. Due date: 02/17/20.  Subjective: comfortable with epidural  Objective: BP (!) 104/51   Pulse 97   Temp 97.6 F (36.4 C) (Axillary)   Resp 18   Ht 5\' 2"  (1.575 m)   Wt 75.3 kg   LMP 05/13/2019   SpO2 95%   BMI 30.36 kg/m  Notable VS details: reviewed  Fetal Assessment: FHT:  FHR: 160 bpm, variability: moderate,  accelerations:  Abscent,  decelerations:  Present variable decels Category/reactivity:  Category II UC:   regular, every 2-3 minutes, Pitocin off after epidural due to late decels and min variability.  SVE: 9/C/+1 with BBOW; AROM performed, large amt meconium stained fluid noted.   Membrane status: AROM at 0137 Amniotic color: moderate meconium  Labs: Lab Results  Component Value Date   WBC 8.5 02/26/2020   HGB 13.8 02/26/2020   HCT 40.0 02/26/2020   MCV 89.7 02/26/2020   PLT 189 02/26/2020    Assessment / Plan: IOL at 41.3wks for late term.   Labor: s/p 2 doses of cytotec, and Cook cath placed, progressing well, pitocin off due to Cat II tracing after epidural.   Preeclampsia:  no e/o pre-e Fetal Wellbeing:  Category II Pain Control:  Labor support without medications I/D:  GBS prophy with PCN, adequately tx.  Anticipated MOD:  NSVD  04/28/2020, CNM 02/27/2020, 1:59 AM

## 2020-02-27 NOTE — Discharge Instructions (Signed)
Discharge Instructions:   Follow-up Appointment: Friday, August 6th at 2:45pm with Heloise Ochoa, CNM at [facility]!   If there are any new medications, they have been ordered and will be available for pickup at the listed pharmacy on your way home from the hospital.   Call office if you have any of the following: headache, visual changes, fever >101.0 F, chills, shortness of breath, breast concerns, excessive vaginal bleeding, incision drainage or problems, leg pain or redness, depression or any other concerns. If you have vaginal discharge with an odor, let your doctor know.   It is normal to bleed for up to 6 weeks. You should not soak through more than 1 pad in 1 hour. If you have a blood clot larger than your fist with continued bleeding, call your doctor.   Activity: Do not lift > 10 lbs for 6 weeks (do not lift anything heavier than your baby). No intercourse, tampons, swimming pools, hot tubs, baths (only showers) for 6 weeks.  No driving for 1-2 weeks. Continue prenatal vitamin, especially if breastfeeding. Increase calories and fluids (water) while breastfeeding.   Your milk will come in, in the next couple of days (right now it is colostrum). You may have a slight fever when your milk comes in, but it should go away on its own.  If it does not, and rises above 101 F please call the doctor. You will also feel achy and your breasts will be firm. They will also start to leak. If you are breastfeeding, continue as you have been and you can pump/express milk for comfort.   If you have too much milk, your breasts can become engorged, which could lead to mastitis. This is an infection of the milk ducts. It can be very painful and you will need to notify your doctor to obtain a prescription for antibiotics. You can also treat it with a shower or hot/cold compress.   For concerns about your baby, please call your pediatrician.  For breastfeeding concerns, the lactation consultant can be  reached at [phone].   Postpartum blues (feelings of happy one minute and sad another minute) are normal for the first few weeks but if it gets worse let your doctor know.   Congratulations! We enjoyed caring for you and your new bundle of joy!

## 2020-02-27 NOTE — Plan of Care (Signed)
Education complete.

## 2020-02-28 LAB — CBC
HCT: 32.6 % — ABNORMAL LOW (ref 36.0–46.0)
Hemoglobin: 11.5 g/dL — ABNORMAL LOW (ref 12.0–15.0)
MCH: 31.3 pg (ref 26.0–34.0)
MCHC: 35.3 g/dL (ref 30.0–36.0)
MCV: 88.8 fL (ref 80.0–100.0)
Platelets: 184 10*3/uL (ref 150–400)
RBC: 3.67 MIL/uL — ABNORMAL LOW (ref 3.87–5.11)
RDW: 14 % (ref 11.5–15.5)
WBC: 12.6 10*3/uL — ABNORMAL HIGH (ref 4.0–10.5)
nRBC: 0 % (ref 0.0–0.2)

## 2020-02-28 MED ORDER — ACETAMINOPHEN 325 MG PO TABS: 650.0000 mg | ORAL_TABLET | ORAL | Status: DC | PRN

## 2020-02-28 MED ORDER — SENNOSIDES-DOCUSATE SODIUM 8.6-50 MG PO TABS: 2.0000 | ORAL_TABLET | Freq: Every day | ORAL | Status: DC | PRN

## 2020-02-28 MED ORDER — IBUPROFEN 600 MG PO TABS: 600.0000 mg | ORAL_TABLET | Freq: Four times a day (QID) | ORAL | 0 refills | Status: DC | PRN

## 2020-02-28 MED ORDER — COCONUT OIL OIL: 1.0000 "application " | TOPICAL_OIL | 0 refills | Status: DC | PRN

## 2020-02-28 MED ORDER — VARICELLA VIRUS VACCINE LIVE 1350 PFU/0.5ML IJ SUSR
0.5000 mL | INTRAMUSCULAR | Status: AC | PRN
  Administered 2020-02-28: 0.5 mL via SUBCUTANEOUS
  Filled 2020-02-28 (×2): qty 0.5

## 2020-02-28 MED ORDER — VARICELLA VIRUS VACCINE LIVE 1350 PFU/0.5ML IJ SUSR: 0.5000 mL | Freq: Once | INTRAMUSCULAR | Status: AC

## 2020-02-28 MED ORDER — BENZOCAINE-MENTHOL 20-0.5 % EX AERO: 1.0000 "application " | INHALATION_SPRAY | CUTANEOUS | Status: DC | PRN

## 2020-02-28 NOTE — Anesthesia Postprocedure Evaluation (Signed)
Anesthesia Post Note  Patient: Hannah Maldonado  Procedure(s) Performed: AN AD HOC LABOR EPIDURAL  Patient location during evaluation: Mother Baby Anesthesia Type: Epidural Level of consciousness: awake and alert Pain management: pain level controlled Vital Signs Assessment: post-procedure vital signs reviewed and stable Respiratory status: spontaneous breathing, nonlabored ventilation and respiratory function stable Cardiovascular status: stable Postop Assessment: no headache, no backache, able to ambulate and adequate PO intake Anesthetic complications: no   No complications documented.   Last Vitals:  Vitals:   02/27/20 2302 02/28/20 0826  BP: 108/69 104/76  Pulse: 86 79  Resp: 18 18  Temp: 36.7 C   SpO2: 99% 99%    Last Pain:  Vitals:   02/28/20 0740  TempSrc:   PainSc: 0-No pain                 Lenard Simmer

## 2020-02-28 NOTE — Progress Notes (Signed)
Patient discharged home with infant. Discharge instructions and prescriptions given and reviewed with patient. Patient verbalized understanding.   Varicella vaccine given prior to discharge.  Pt interested in getting COVID vaccine and plans to talk with Heloise Ochoa, CNM at her follow-up visit.   Escorted out by staff.

## 2020-03-18 ENCOUNTER — Emergency Department: Payer: Medicaid Other

## 2020-03-18 ENCOUNTER — Encounter: Payer: Self-pay | Admitting: Emergency Medicine

## 2020-03-18 ENCOUNTER — Other Ambulatory Visit: Payer: Self-pay

## 2020-03-18 ENCOUNTER — Inpatient Hospital Stay
Admission: EM | Admit: 2020-03-18 | Discharge: 2020-03-20 | DRG: 776 | Disposition: A | Discharge status: Home / Self Care | Payer: Medicaid Other

## 2020-03-18 DIAGNOSIS — O8612 Endometritis following delivery: Principal | ICD-10-CM | POA: Diagnosis present

## 2020-03-18 DIAGNOSIS — R1030 Lower abdominal pain, unspecified: Secondary | ICD-10-CM | POA: Diagnosis present

## 2020-03-18 DIAGNOSIS — Z20822 Contact with and (suspected) exposure to covid-19: Secondary | ICD-10-CM | POA: Diagnosis present

## 2020-03-18 DIAGNOSIS — O99893 Other specified diseases and conditions complicating puerperium: Secondary | ICD-10-CM | POA: Diagnosis present

## 2020-03-18 DIAGNOSIS — R509 Fever, unspecified: Secondary | ICD-10-CM

## 2020-03-18 DIAGNOSIS — N83201 Unspecified ovarian cyst, right side: Secondary | ICD-10-CM | POA: Diagnosis present

## 2020-03-18 DIAGNOSIS — N719 Inflammatory disease of uterus, unspecified: Secondary | ICD-10-CM

## 2020-03-18 LAB — URINALYSIS, COMPLETE (UACMP) WITH MICROSCOPIC
Bacteria, UA: NONE SEEN
Bilirubin Urine: NEGATIVE
Glucose, UA: NEGATIVE mg/dL
Ketones, ur: NEGATIVE mg/dL
Leukocytes,Ua: NEGATIVE
Nitrite: NEGATIVE
Protein, ur: NEGATIVE mg/dL
Specific Gravity, Urine: 1.02 (ref 1.005–1.030)
pH: 6 (ref 5.0–8.0)

## 2020-03-18 LAB — CBC WITH DIFFERENTIAL/PLATELET
Abs Immature Granulocytes: 0.05 10*3/uL (ref 0.00–0.07)
Basophils Absolute: 0 10*3/uL (ref 0.0–0.1)
Basophils Relative: 0 %
Eosinophils Absolute: 0 10*3/uL (ref 0.0–0.5)
Eosinophils Relative: 0 %
HCT: 40.7 % (ref 36.0–46.0)
Hemoglobin: 14 g/dL (ref 12.0–15.0)
Immature Granulocytes: 0 %
Lymphocytes Relative: 10 %
Lymphs Abs: 1.5 10*3/uL (ref 0.7–4.0)
MCH: 31.2 pg (ref 26.0–34.0)
MCHC: 34.4 g/dL (ref 30.0–36.0)
MCV: 90.6 fL (ref 80.0–100.0)
Monocytes Absolute: 0.9 10*3/uL (ref 0.1–1.0)
Monocytes Relative: 6 %
Neutro Abs: 12.7 10*3/uL — ABNORMAL HIGH (ref 1.7–7.7)
Neutrophils Relative %: 84 %
Platelets: 253 10*3/uL (ref 150–400)
RBC: 4.49 MIL/uL (ref 3.87–5.11)
RDW: 12.6 % (ref 11.5–15.5)
WBC: 15.1 10*3/uL — ABNORMAL HIGH (ref 4.0–10.5)
nRBC: 0 % (ref 0.0–0.2)

## 2020-03-18 LAB — HCG, QUANTITATIVE, PREGNANCY: hCG, Beta Chain, Quant, S: 3 m[IU]/mL (ref <5)

## 2020-03-18 LAB — COMPREHENSIVE METABOLIC PANEL
ALT: 26 U/L (ref 0–44)
AST: 25 U/L (ref 15–41)
Albumin: 4.2 g/dL (ref 3.5–5.0)
Alkaline Phosphatase: 66 U/L (ref 38–126)
Anion gap: 11 (ref 5–15)
BUN: 16 mg/dL (ref 6–20)
CO2: 21 mmol/L — ABNORMAL LOW (ref 22–32)
Calcium: 8.4 mg/dL — ABNORMAL LOW (ref 8.9–10.3)
Chloride: 104 mmol/L (ref 98–111)
Creatinine, Ser: 0.76 mg/dL (ref 0.44–1.00)
GFR calc Af Amer: >60 mL/min (ref >60)
GFR calc non Af Amer: >60 mL/min (ref >60)
Glucose, Bld: 94 mg/dL (ref 70–99)
Potassium: 3.8 mmol/L (ref 3.5–5.1)
Sodium: 136 mmol/L (ref 135–145)
Total Bilirubin: 1 mg/dL (ref 0.3–1.2)
Total Protein: 7.8 g/dL (ref 6.5–8.1)

## 2020-03-18 LAB — WET PREP, GENITAL
Clue Cells Wet Prep HPF POC: NONE SEEN
Sperm: NONE SEEN
Trich, Wet Prep: NONE SEEN
Yeast Wet Prep HPF POC: NONE SEEN

## 2020-03-18 LAB — LACTIC ACID, PLASMA: Lactic Acid, Venous: 0.9 mmol/L (ref 0.5–1.9)

## 2020-03-18 LAB — LIPASE, BLOOD: Lipase: 52 U/L — ABNORMAL HIGH (ref 11–51)

## 2020-03-18 LAB — CHLAMYDIA/NGC RT PCR (ARMC ONLY)
Chlamydia Tr: NOT DETECTED
N gonorrhoeae: NOT DETECTED

## 2020-03-18 MED ORDER — LACTATED RINGERS IV BOLUS
1000.0000 mL | Freq: Once | INTRAVENOUS | Status: AC
  Administered 2020-03-18: 1000 mL via INTRAVENOUS

## 2020-03-18 MED ORDER — SODIUM CHLORIDE 0.9% FLUSH
3.0000 mL | Freq: Once | INTRAVENOUS | Status: AC
  Administered 2020-03-19: 3 mL via INTRAVENOUS

## 2020-03-18 MED ORDER — SODIUM CHLORIDE 0.9 % IV SOLN
3.0000 g | Freq: Once | INTRAVENOUS | Status: AC
  Administered 2020-03-18: 3 g via INTRAVENOUS
  Filled 2020-03-18: qty 8

## 2020-03-18 MED ORDER — ACETAMINOPHEN 325 MG PO TABS
650.0000 mg | ORAL_TABLET | Freq: Once | ORAL | Status: DC | PRN
  Filled 2020-03-18: qty 2

## 2020-03-18 MED ORDER — ACETAMINOPHEN 500 MG PO TABS
1000.0000 mg | ORAL_TABLET | Freq: Once | ORAL | Status: AC
  Administered 2020-03-18: 1000 mg via ORAL
  Filled 2020-03-18: qty 2

## 2020-03-18 MED ORDER — GENTAMICIN SULFATE 40 MG/ML IJ SOLN
1.5000 mg/kg | Freq: Once | INTRAVENOUS | Status: AC
  Administered 2020-03-18: 110 mg via INTRAVENOUS
  Filled 2020-03-18: qty 2.75

## 2020-03-18 MED ORDER — ACETAMINOPHEN 325 MG PO TABS
650.0000 mg | ORAL_TABLET | ORAL | Status: DC | PRN
  Administered 2020-03-18 – 2020-03-20 (×3): 650 mg via ORAL
  Filled 2020-03-18 (×4): qty 2

## 2020-03-18 NOTE — Consult Note (Signed)
Consult History and Physical   SERVICE: Gynecology   Patient Name: Hannah Maldonado Patient MRN:   [id number]  CC: Abdominal pain   HPI: Hannah Maldonado is a 34 y.o. G1P1001 who presents to the ED for evaluation of abdominal pain.  She had an uncomplicated vaginal birth with a 2nd degree laceration and repair on 02/27/2020.  She was positive for GBS and adequately treated.  She had been feeling well up until yesterday when she developed fever, chills, and lower abdominal cramping.  Reports that pain is intermittent.  Vaginal bleeding has improved since her delivery and is currently having light spotting.  She denies any change in discharge or odor.     Review of Systems:  Review of Systems  Constitutional: Positive for chills, fever and malaise/fatigue.  Respiratory: Negative for cough and shortness of breath.   Cardiovascular: Negative for chest pain.  Gastrointestinal: Positive for abdominal pain. Negative for nausea and vomiting.  Genitourinary: Negative for dysuria, flank pain and frequency.  Musculoskeletal: Negative for back pain and myalgias.  Skin: Negative for rash.  Neurological: Negative for dizziness and headaches.  Psychiatric/Behavioral: The patient is not nervous/anxious.     Past Obstetrical History: OB History    Gravida  1   Para  1   Term  1   Preterm      AB      Living  1     SAB      TAB      Ectopic      Multiple  0   Live Births  1           Past Medical History: Past Medical History:  Diagnosis Date  . Anxiety   . Depression   . Hypothyroidism   . Thyroid disease     Past Surgical History:   Past Surgical History:  Procedure Laterality Date  . THYROIDECTOMY      Family History:  Family history reviewed, no pertinent history, denies history of gyn cancers   Social History:   reports that she has never smoked. She has never used smokeless tobacco. She reports previous alcohol use. She reports that she does  not use drugs.  Home Medications:  Medications reconciled in EPIC  No current facility-administered medications on file prior to encounter.   Current Outpatient Medications on File Prior to Encounter  Medication Sig Dispense Refill  . levothyroxine (SYNTHROID) 150 MCG tablet Take 150 mcg by mouth daily before breakfast.    . Prenatal Vit-Fe Fumarate-FA (PRENATAL MULTIVITAMIN) TABS tablet Take 1 tablet by mouth daily at 12 noon.    Marland Kitchen acetaminophen (TYLENOL) 325 MG tablet Take 2 tablets (650 mg total) by mouth every 4 (four) hours as needed (for pain scale < 4).    . benzocaine-Menthol (DERMOPLAST) 20-0.5 % AERO Apply 1 application topically as needed for irritation (perineal discomfort). (Patient not taking: Reported on 03/18/2020)    . coconut oil OIL Apply 1 application topically as needed.  0  . ibuprofen (ADVIL) 600 MG tablet Take 1 tablet (600 mg total) by mouth every 6 (six) hours as needed for mild pain or moderate pain. 30 tablet 0  . senna-docusate (SENOKOT-S) 8.6-50 MG tablet Take 2 tablets by mouth daily as needed for mild constipation.      Allergies:  Allergies  Allergen Reactions  . Torecan [Thiethylperazine] Other (See Comments)    Muscle spasms    Physical Exam:  Temp:  [102.5 F (39.2 C)] 102.5 F (39.2 C) (07/29 1501)  Pulse Rate:  [122] 122 (07/29 1501) Resp:  [20] 20 (07/29 1501) BP: (116)/(85) 116/85 (07/29 1501) SpO2:  [94 %] 94 % (07/29 1501) Weight:  [75.3 kg] 75.3 kg (07/29 1501)  Physical Exam Constitutional:      Appearance: Normal appearance.  Cardiovascular:     Rate and Rhythm: Normal rate and regular rhythm.     Heart sounds: Normal heart sounds.  Pulmonary:     Effort: Pulmonary effort is normal.     Breath sounds: Normal breath sounds.  Abdominal:     General: Abdomen is flat. Bowel sounds are normal.     Palpations: Abdomen is soft. There is no mass.     Tenderness: There is abdominal tenderness (uterine tenderness). There is no rebound.   Genitourinary:    General: Normal vulva.     Vagina: Normal.     Cervix: No cervical motion tenderness.     Uterus: Tender.      Adnexa: Right adnexa normal and left adnexa normal.  Musculoskeletal:        General: Normal range of motion.  Skin:    General: Skin is warm and dry.     Capillary Refill: Capillary refill takes 2 to 3 seconds.  Neurological:     Mental Status: She is alert and oriented to person, place, and time.  Psychiatric:        Mood and Affect: Mood normal.     Labs/Studies:   CBC and Coags:  Lab Results  Component Value Date   WBC 15.1 (H) 03/18/2020   NEUTOPHILPCT 84 03/18/2020   EOSPCT 0 03/18/2020   BASOPCT 0 03/18/2020   LYMPHOPCT 10 03/18/2020   HGB 14.0 03/18/2020   HCT 40.7 03/18/2020   MCV 90.6 03/18/2020   PLT 253 03/18/2020   CMP:  Lab Results  Component Value Date   NA 136 03/18/2020   K 3.8 03/18/2020   CL 104 03/18/2020   CO2 21 (L) 03/18/2020   BUN 16 03/18/2020   CREATININE 0.76 03/18/2020   CREATININE 0.75 07/14/2019   PROT 7.8 03/18/2020   BILITOT 1.0 03/18/2020   ALT 26 03/18/2020   AST 25 03/18/2020   ALKPHOS 66 03/18/2020   Other Imaging: DG Chest 2 View  Result Date: 03/18/2020 CLINICAL DATA:  Three weeks postpartum with sepsis. EXAM: CHEST - 2 VIEW COMPARISON:  None. FINDINGS: The heart size and mediastinal contours are within normal limits. Both lungs are clear. The visualized skeletal structures are unremarkable. IMPRESSION: No active cardiopulmonary disease. Electronically Signed   By: Romona Curls M.D.   On: 03/18/2020 16:13   US PELVIS (TRANSABDOMINAL ONLY)  Result Date: 03/18/2020 CLINICAL DATA:  The patient is 3 weeks postpartum and has fever. Concern for endometritis, retained products of conception. EXAM: TRANSABDOMINAL ULTRASOUND OF PELVIS DOPPLER ULTRASOUND OF OVARIES TECHNIQUE: Transabdominal ultrasound examination of the pelvis was performed including evaluation of the uterus, ovaries, adnexal regions,  and pelvic cul-de-sac. Color and duplex Doppler ultrasound was utilized to evaluate blood flow to the ovaries. COMPARISON:  Ob ultrasound dated 11/10/2019. FINDINGS: Uterus Measurements: 9.4 x 6.2 x 8.3 cm = volume: 252 mL. No fibroids or other mass visualized. Endometrium Thickness: 2.7 cm. No focal abnormality visualized. No significant hyperemia or definite evidence of retained products of conception. No definite intrauterine gas. Right ovary Measurements: 3.9 x 2.2 x 3.5 cm = volume: 16 mL. Two adnexal cysts are noted, measuring 5.9 x 3.4 x 5.4 cm and 3.5 x 2.9 x 2.4 cm. Left ovary  Measurements: 3.6 x 3.3 x 2.5 cm = volume: 15 mL. Normal appearance/no adnexal mass. Pulsed Doppler evaluation demonstrates normal low-resistance arterial and venous waveforms in both ovaries. Other: There is trace free fluid in the pelvis. IMPRESSION: Thickened endometrium likely represents endometritis. Electronically Signed   By: Romona Curls M.D.   On: 03/18/2020 17:19   US PELVIC DOPPLER (TORSION R/O OR MASS ARTERIAL FLOW)  Result Date: 03/18/2020 CLINICAL DATA:  The patient is 3 weeks postpartum and has fever. Concern for endometritis, retained products of conception. EXAM: TRANSABDOMINAL ULTRASOUND OF PELVIS DOPPLER ULTRASOUND OF OVARIES TECHNIQUE: Transabdominal ultrasound examination of the pelvis was performed including evaluation of the uterus, ovaries, adnexal regions, and pelvic cul-de-sac. Color and duplex Doppler ultrasound was utilized to evaluate blood flow to the ovaries. COMPARISON:  Ob ultrasound dated 11/10/2019. FINDINGS: Uterus Measurements: 9.4 x 6.2 x 8.3 cm = volume: 252 mL. No fibroids or other mass visualized. Endometrium Thickness: 2.7 cm. No focal abnormality visualized. No significant hyperemia or definite evidence of retained products of conception. No definite intrauterine gas. Right ovary Measurements: 3.9 x 2.2 x 3.5 cm = volume: 16 mL. Two adnexal cysts are noted, measuring 5.9 x 3.4 x 5.4 cm  and 3.5 x 2.9 x 2.4 cm. Left ovary Measurements: 3.6 x 3.3 x 2.5 cm = volume: 15 mL. Normal appearance/no adnexal mass. Pulsed Doppler evaluation demonstrates normal low-resistance arterial and venous waveforms in both ovaries. Other: There is trace free fluid in the pelvis. IMPRESSION: Thickened endometrium likely represents endometritis. Electronically Signed   By: Romona Curls M.D.   On: 03/18/2020 17:19     Assessment / Plan:   ZENAYA ULATOWSKI is a 34 y.o. G1P1001 who presents with lower abdominal pain.     1. Exam consistent with postpartum endometritis.  Fundus is tender to palpation.  Her vaginal birth was uncomplicated and she was adequately treated for GBS.   2. Admit inpatient for IV antibiotics for postpartum endometritis.   3. Right ovarian cyst noted on [place] - will monitor for worsening abdominal pain.  4. Lactation consult PRN breastfeeding/pumping  5. Repeat CBC in AM  6. Aerobic and anaerobic cultures pending  7. Plan of care and admission discussed with Dr. Tiburcio Pea.    Thank you for the opportunity to be involved with this patient's care.  ----- Margaretmary Eddy, CNM Midwife [facility], Department of OB/GYN [facility]

## 2020-03-18 NOTE — ED Provider Notes (Signed)
[facility] Emergency Department Provider Note   ____________________________________________   First MD Initiated Contact with Patient 03/18/20 1621     (approximate)  I have reviewed the triage vital signs and the nursing notes.   HISTORY  Chief Complaint Abdominal Pain    HPI Hannah Maldonado is a 34 y.o. female with past medical history of hypothyroidism who presents to the ED complaining of abdominal pain.  History is limited as patient is Spanish-speaking only and history obtained via bedside interpreter.  Patient reports that she had an uncomplicated vaginal delivery on July 9, was discharged home with her baby on time afterwards.  She states she had been feeling well up until yesterday, when she developed fever, chills, and crampy lower abdominal pain.  She describes the pain as intermittent and similar to cramps that she has with her menses.  She states that her vaginal bleeding has been improving since delivery and she is only having light spotting at this time, has not noticed any discharge.  She describes subjective fevers and chills, but has not taken her temperature at home.        Past Medical History:  Diagnosis Date  . Anxiety   . Depression   . Hypothyroidism   . Thyroid disease     Patient Active Problem List   Diagnosis Date Noted  . Labor and delivery indication for care or intervention 02/26/2020    Past Surgical History:  Procedure Laterality Date  . THYROIDECTOMY      Prior to Admission medications   Medication Sig Start Date End Date Taking? Authorizing Provider  levothyroxine (SYNTHROID) 150 MCG tablet Take 150 mcg by mouth daily before breakfast.   Yes [provider]  Prenatal Vit-Fe Fumarate-FA (PRENATAL MULTIVITAMIN) TABS tablet Take 1 tablet by mouth daily at 12 noon.   Yes [provider]  acetaminophen (TYLENOL) 325 MG tablet Take 2 tablets (650 mg total) by mouth every 4 (four) hours as  needed (for pain scale < 4). 02/28/20   Gustavo Lah, CNM  benzocaine-Menthol (DERMOPLAST) 20-0.5 % AERO Apply 1 application topically as needed for irritation (perineal discomfort). Patient not taking: Reported on 03/18/2020 02/28/20   Gustavo Lah, CNM  coconut oil OIL Apply 1 application topically as needed. 02/28/20   Gustavo Lah, CNM  ibuprofen (ADVIL) 600 MG tablet Take 1 tablet (600 mg total) by mouth every 6 (six) hours as needed for mild pain or moderate pain. 02/28/20   Gustavo Lah, CNM  senna-docusate (SENOKOT-S) 8.6-50 MG tablet Take 2 tablets by mouth daily as needed for mild constipation. 02/28/20   Gustavo Lah, CNM    Allergies Torecan [thiethylperazine]  No family history on file.  Social History Social History   Tobacco Use  . Smoking status: Never Smoker  . Smokeless tobacco: Never Used  Substance Use Topics  . Alcohol use: Not Currently  . Drug use: Never    Review of Systems  Constitutional: Positive for fever/chills Eyes: No visual changes. ENT: No sore throat. Cardiovascular: Denies chest pain. Respiratory: Denies shortness of breath. Gastrointestinal: Positive for abdominal pain.  No nausea, no vomiting.  No diarrhea.  No constipation. Genitourinary: Negative for dysuria. Musculoskeletal: Negative for back pain. Skin: Negative for rash. Neurological: Negative for headaches, focal weakness or numbness.  ____________________________________________   PHYSICAL EXAM:  VITAL SIGNS: ED Triage Vitals [03/18/20 1501]  Enc Vitals Group     BP 116/85     Pulse Rate (!) 122  Resp 20     Temp (!) 102.5 F (39.2 C)     Temp Source Oral     SpO2 94 %     Weight 166 lb (75.3 kg)     Height 5\' 2"  (1.575 m)     Head Circumference      Peak Flow      Pain Score 8     Pain Loc      Pain Edu?      Excl. in GC?     Constitutional: Alert and oriented. Eyes: Conjunctivae are normal. Head: Atraumatic. Nose: No  congestion/rhinnorhea. Mouth/Throat: Mucous membranes are moist. Neck: Normal ROM Cardiovascular: Normal rate, regular rhythm. Grossly normal heart sounds. Respiratory: Normal respiratory effort.  No retractions. Lungs CTAB. Gastrointestinal: Soft and tender to palpation in the suprapubic area with no rebound or guarding. No distention. Genitourinary: Dark brown and bloody discharge noted with no cervical motion or neck cell tenderness. Musculoskeletal: No lower extremity tenderness nor edema. Neurologic:  Normal speech and language. No gross focal neurologic deficits are appreciated. Skin:  Skin is warm, dry and intact. No rash noted. Psychiatric: Mood and affect are normal. Speech and behavior are normal.  ____________________________________________   LABS (all labs ordered are listed, but only abnormal results are displayed)  Labs Reviewed  WET PREP, GENITAL - Abnormal; Notable for the following components:      Result Value   WBC, Wet Prep HPF POC RARE (*)    All other components within normal limits  LIPASE, BLOOD - Abnormal; Notable for the following components:   Lipase 52 (*)    All other components within normal limits  COMPREHENSIVE METABOLIC PANEL - Abnormal; Notable for the following components:   CO2 21 (*)    Calcium 8.4 (*)    All other components within normal limits  URINALYSIS, COMPLETE (UACMP) WITH MICROSCOPIC - Abnormal; Notable for the following components:   Color, Urine YELLOW (*)    APPearance HAZY (*)    Hgb urine dipstick MODERATE (*)    All other components within normal limits  CBC WITH DIFFERENTIAL/PLATELET - Abnormal; Notable for the following components:   WBC 15.1 (*)    Neutro Abs 12.7 (*)    All other components within normal limits  CHLAMYDIA/NGC RT PCR ([facility] ONLY)  CULTURE, BLOOD (ROUTINE X 2)  CULTURE, BLOOD (ROUTINE X 2)  AEROBIC/ANAEROBIC CULTURE (SURGICAL/DEEP WOUND)  LACTIC ACID, PLASMA  HCG, QUANTITATIVE, PREGNANCY  POC  URINE PREG, ED     PROCEDURES  Procedure(s) performed (including Critical Care):  .Critical Care Performed by: , MD Authorized by: Chesley Noon, MD   Critical care provider statement:    Critical care time (minutes):  45   Critical care time was exclusive of:  Separately billable procedures and treating other patients and teaching time   Critical care was necessary to treat or prevent imminent or life-threatening deterioration of the following conditions:  Sepsis   Critical care was time spent personally by me on the following activities:  Discussions with consultants, evaluation of patient's response to treatment, examination of patient, ordering and performing treatments and interventions, ordering and review of laboratory studies, ordering and review of radiographic studies, pulse oximetry, re-evaluation of patient's condition, obtaining history from patient or surrogate and review of old charts   I assumed direction of critical care for this patient from another provider in my specialty: no       ____________________________________________   INITIAL IMPRESSION / ASSESSMENT AND PLAN / ED  COURSE       34 year old female with recent uncomplicated vaginal delivery 20 days ago presents to the ED with onset of subjective fevers and chills as well as lower abdominal pain yesterday.  Initial vital signs show fever and tachycardia concerning for sepsis.  Patient also has tenderness on abdominal exam and discharge concerning for endometritis, ultrasound confirms thickened endometrium with no evidence of retained products of conception.  We will initiate IV fluid resuscitation and broad-spectrum antibiotics, patient treated with gentamicin and Unasyn as this is preferred to clindamycin in the setting of breast-feeding.  Lab work shows leukocytosis but is otherwise unremarkable.  Case discussed with Dr. Tiburcio Pea of OB/GYN, patient to be evaluated by OB/GYN team.  Midwife at  bedside to evaluate patient and OB/GYN to admit for antibiotic treatment of endometritis.      ____________________________________________   FINAL CLINICAL IMPRESSION(S) / ED DIAGNOSES  Final diagnoses:  Endometritis     ED Discharge Orders    None       Note:  This document was prepared using Dragon voice recognition software and may include unintentional dictation errors.   Chesley Noon, MD 03/18/20 2035

## 2020-03-18 NOTE — ED Triage Notes (Signed)
Pt presents to ED via POV, reports is 3 weeks post-partum from having 1st baby. Pt reports yesterday had generalized abd pain lasting approx 5 minutes, relieved with rest. Pt c/o intermittent generalized abdominal pain. Pt c/o small amounts of vaginal bleeding at this time.   Pt denies known covid exposure, febrile on arrival to ED.

## 2020-03-18 NOTE — ED Notes (Signed)
Breast pump provided for patient. Patient pumping with RN assistance.

## 2020-03-18 NOTE — ED Notes (Signed)
Patient up to toilet.  

## 2020-03-19 HISTORY — PX: CHOLECYSTECTOMY: SHX55

## 2020-03-19 LAB — CBC
HCT: 39.5 % (ref 36.0–46.0)
Hemoglobin: 13.1 g/dL (ref 12.0–15.0)
MCH: 30.9 pg (ref 26.0–34.0)
MCHC: 33.2 g/dL (ref 30.0–36.0)
MCV: 93.2 fL (ref 80.0–100.0)
Platelets: 215 10*3/uL (ref 150–400)
RBC: 4.24 MIL/uL (ref 3.87–5.11)
RDW: 12.7 % (ref 11.5–15.5)
WBC: 9.5 10*3/uL (ref 4.0–10.5)
nRBC: 0 % (ref 0.0–0.2)

## 2020-03-19 LAB — SARS CORONAVIRUS 2 BY RT PCR (HOSPITAL ORDER, PERFORMED IN ~~LOC~~ HOSPITAL LAB): SARS Coronavirus 2: NEGATIVE

## 2020-03-19 MED ORDER — LEVOTHYROXINE SODIUM 125 MCG PO TABS: 125.0000 ug | ORAL_TABLET | Freq: Every day | ORAL | Status: DC

## 2020-03-19 MED ORDER — SODIUM CHLORIDE 0.9 % IV SOLN
3.0000 g | Freq: Four times a day (QID) | INTRAVENOUS | Status: DC
  Administered 2020-03-19 – 2020-03-20 (×7): 3 g via INTRAVENOUS
  Filled 2020-03-19 (×2): qty 8
  Filled 2020-03-19 (×2): qty 3
  Filled 2020-03-19 (×3): qty 8
  Filled 2020-03-19 (×4): qty 3
  Filled 2020-03-19: qty 8

## 2020-03-19 MED ORDER — ONDANSETRON HCL 4 MG PO TABS
4.0000 mg | ORAL_TABLET | ORAL | Status: DC | PRN
  Administered 2020-03-19: 4 mg via ORAL

## 2020-03-19 MED ORDER — ONDANSETRON HCL 4 MG/2ML IJ SOLN: 4.0000 mg | INTRAMUSCULAR | Status: DC | PRN

## 2020-03-19 MED ORDER — GENTAMICIN SULFATE 40 MG/ML IJ SOLN
5.0000 mg/kg | INTRAVENOUS | Status: DC
  Administered 2020-03-19 – 2020-03-20 (×2): 300 mg via INTRAVENOUS
  Filled 2020-03-19 (×4): qty 7.5

## 2020-03-19 MED ORDER — PRENATAL MULTIVITAMIN CH
1.0000 | ORAL_TABLET | Freq: Every day | ORAL | Status: DC
  Administered 2020-03-19 – 2020-03-20 (×2): 1 via ORAL
  Filled 2020-03-19 (×2): qty 1

## 2020-03-19 MED ORDER — IBUPROFEN 600 MG PO TABS
600.0000 mg | ORAL_TABLET | Freq: Four times a day (QID) | ORAL | Status: DC | PRN
  Administered 2020-03-19 – 2020-03-20 (×3): 600 mg via ORAL
  Filled 2020-03-19 (×3): qty 1

## 2020-03-19 MED ORDER — LEVOTHYROXINE SODIUM 125 MCG PO TABS
125.0000 ug | ORAL_TABLET | Freq: Every day | ORAL | Status: DC
  Administered 2020-03-19 – 2020-03-20 (×2): 125 ug via ORAL
  Filled 2020-03-19 (×3): qty 1

## 2020-03-19 MED ORDER — LEVOTHYROXINE SODIUM 150 MCG PO TABS
150.0000 ug | ORAL_TABLET | Freq: Every day | ORAL | Status: DC
  Filled 2020-03-19: qty 1

## 2020-03-19 NOTE — ED Notes (Signed)
Patient c/o chills

## 2020-03-19 NOTE — Consult Note (Signed)
Consult History and Physical   SERVICE: Gynecology   Patient Name: Hannah Maldonado Patient MRN:   [id number]  CC: Abdominal pain   HPI: Hannah Maldonado is a 34 y.o. G1P1001 who presented to the ED for evaluation of abdominal pain, fever, and chills.  She had an uncomplicated vaginal birth with a 2nd degree laceration and repair on 02/27/2020. Reports intermittent lower abdominal cramping.  Vaginal bleeding is very minimal     Review of Systems:  Review of Systems  Constitutional: Negative for chills, fever and malaise/fatigue.  Respiratory: Negative for cough and shortness of breath.   Cardiovascular: Negative for chest pain.  Gastrointestinal: Positive for abdominal pain. Negative for nausea and vomiting.  Genitourinary: Negative for dysuria, flank pain and frequency.  Musculoskeletal: Negative for back pain and myalgias.  Neurological: Negative for dizziness and headaches.    Past Obstetrical History: OB History    Gravida  1   Para  1   Term  1   Preterm      AB      Living  1     SAB      TAB      Ectopic      Multiple  0   Live Births  1           Past Medical History: Past Medical History:  Diagnosis Date   Anxiety    Depression    Hypothyroidism    Thyroid disease     Past Surgical History:   Past Surgical History:  Procedure Laterality Date   THYROIDECTOMY      Family History:  Family history reviewed, no pertinent history  Social History:   reports that she has never smoked. She has never used smokeless tobacco. She reports previous alcohol use. She reports that she does not use drugs.  Home Medications:  Medications reconciled in EPIC  No current facility-administered medications on file prior to encounter.   Current Outpatient Medications on File Prior to Encounter  Medication Sig Dispense Refill   levothyroxine (SYNTHROID) 150 MCG tablet Take 150 mcg by mouth daily before breakfast.     Prenatal Vit-Fe  Fumarate-FA (PRENATAL MULTIVITAMIN) TABS tablet Take 1 tablet by mouth daily at 12 noon.     acetaminophen (TYLENOL) 325 MG tablet Take 2 tablets (650 mg total) by mouth every 4 (four) hours as needed (for pain scale < 4).     benzocaine-Menthol (DERMOPLAST) 20-0.5 % AERO Apply 1 application topically as needed for irritation (perineal discomfort). (Patient not taking: Reported on 03/18/2020)     coconut oil OIL Apply 1 application topically as needed.  0   ibuprofen (ADVIL) 600 MG tablet Take 1 tablet (600 mg total) by mouth every 6 (six) hours as needed for mild pain or moderate pain. 30 tablet 0   senna-docusate (SENOKOT-S) 8.6-50 MG tablet Take 2 tablets by mouth daily as needed for mild constipation.      Allergies:  Allergies  Allergen Reactions   Torecan [Thiethylperazine] Other (See Comments)    Muscle spasms    Physical Exam:  Temp:  [97.8 F (36.6 C)-101.8 F (38.8 C)] 99.6 F (37.6 C) (07/30 1914) Pulse Rate:  [65-92] 65 (07/30 1914) Resp:  [16-20] 20 (07/30 1914) BP: (92-153)/(56-105) 107/69 (07/30 1914) SpO2:  [97 %-100 %] 100 % (07/30 1914)  Physical Exam Constitutional:      Appearance: Normal appearance.  Cardiovascular:     Rate and Rhythm: Normal rate and regular rhythm.  Pulmonary:  Effort: Pulmonary effort is normal.  Abdominal:     General: Abdomen is flat.     Palpations: Abdomen is soft. There is no mass.     Tenderness: There is abdominal tenderness.  Genitourinary:    General: Normal vulva.  Musculoskeletal:        General: Normal range of motion.  Skin:    General: Skin is warm and dry.  Neurological:     Mental Status: She is alert and oriented to person, place, and time.  Psychiatric:        Mood and Affect: Mood normal.     Labs/Studies:   CBC and Coags:  Lab Results  Component Value Date   WBC 9.5 03/19/2020   NEUTOPHILPCT 84 03/18/2020   EOSPCT 0 03/18/2020   BASOPCT 0 03/18/2020   LYMPHOPCT 10 03/18/2020   HGB 13.1  03/19/2020   HCT 39.5 03/19/2020   MCV 93.2 03/19/2020   PLT 215 03/19/2020   CMP:  Lab Results  Component Value Date   NA 136 03/18/2020   K 3.8 03/18/2020   CL 104 03/18/2020   CO2 21 (L) 03/18/2020   BUN 16 03/18/2020   CREATININE 0.76 03/18/2020   CREATININE 0.75 07/14/2019   PROT 7.8 03/18/2020   BILITOT 1.0 03/18/2020   ALT 26 03/18/2020   AST 25 03/18/2020   ALKPHOS 66 03/18/2020   Other Imaging: DG Chest 2 View  Result Date: 03/18/2020 CLINICAL DATA:  Three weeks postpartum with sepsis. EXAM: CHEST - 2 VIEW COMPARISON:  None. FINDINGS: The heart size and mediastinal contours are within normal limits. Both lungs are clear. The visualized skeletal structures are unremarkable. IMPRESSION: No active cardiopulmonary disease. Electronically Signed   By: Romona Curls M.D.   On: 03/18/2020 16:13   US PELVIS (TRANSABDOMINAL ONLY)  Result Date: 03/18/2020 CLINICAL DATA:  The patient is 3 weeks postpartum and has fever. Concern for endometritis, retained products of conception. EXAM: TRANSABDOMINAL ULTRASOUND OF PELVIS DOPPLER ULTRASOUND OF OVARIES TECHNIQUE: Transabdominal ultrasound examination of the pelvis was performed including evaluation of the uterus, ovaries, adnexal regions, and pelvic cul-de-sac. Color and duplex Doppler ultrasound was utilized to evaluate blood flow to the ovaries. COMPARISON:  Ob ultrasound dated 11/10/2019. FINDINGS: Uterus Measurements: 9.4 x 6.2 x 8.3 cm = volume: 252 mL. No fibroids or other mass visualized. Endometrium Thickness: 2.7 cm. No focal abnormality visualized. No significant hyperemia or definite evidence of retained products of conception. No definite intrauterine gas. Right ovary Measurements: 3.9 x 2.2 x 3.5 cm = volume: 16 mL. Two adnexal cysts are noted, measuring 5.9 x 3.4 x 5.4 cm and 3.5 x 2.9 x 2.4 cm. Left ovary Measurements: 3.6 x 3.3 x 2.5 cm = volume: 15 mL. Normal appearance/no adnexal mass. Pulsed Doppler evaluation demonstrates  normal low-resistance arterial and venous waveforms in both ovaries. Other: There is trace free fluid in the pelvis. IMPRESSION: Thickened endometrium likely represents endometritis. Electronically Signed   By: Romona Curls M.D.   On: 03/18/2020 17:19   US PELVIC DOPPLER (TORSION R/O OR MASS ARTERIAL FLOW)  Result Date: 03/18/2020 CLINICAL DATA:  The patient is 3 weeks postpartum and has fever. Concern for endometritis, retained products of conception. EXAM: TRANSABDOMINAL ULTRASOUND OF PELVIS DOPPLER ULTRASOUND OF OVARIES TECHNIQUE: Transabdominal ultrasound examination of the pelvis was performed including evaluation of the uterus, ovaries, adnexal regions, and pelvic cul-de-sac. Color and duplex Doppler ultrasound was utilized to evaluate blood flow to the ovaries. COMPARISON:  Ob ultrasound dated 11/10/2019. FINDINGS: Uterus  Measurements: 9.4 x 6.2 x 8.3 cm = volume: 252 mL. No fibroids or other mass visualized. Endometrium Thickness: 2.7 cm. No focal abnormality visualized. No significant hyperemia or definite evidence of retained products of conception. No definite intrauterine gas. Right ovary Measurements: 3.9 x 2.2 x 3.5 cm = volume: 16 mL. Two adnexal cysts are noted, measuring 5.9 x 3.4 x 5.4 cm and 3.5 x 2.9 x 2.4 cm. Left ovary Measurements: 3.6 x 3.3 x 2.5 cm = volume: 15 mL. Normal appearance/no adnexal mass. Pulsed Doppler evaluation demonstrates normal low-resistance arterial and venous waveforms in both ovaries. Other: There is trace free fluid in the pelvis. IMPRESSION: Thickened endometrium likely represents endometritis. Electronically Signed   By: Romona Curls M.D.   On: 03/18/2020 17:19     Assessment / Plan:   PAMLA PANGLE is a 35 y.o. G1P1001 who presents with lower abdominal pain.     1. Endometritis - afebrile since 0318 (03/19/20) 2. Continue Gentamicin 30mg  IV QD and Unasyn 3g IV q6hrs 3. Lactation consult PRN breastfeeding/pumping  4. Aerobic and anaerobic  cultures pending, Blood cultures - no growth in <24 hours 5. If pt remains afebrile x 24 hours, may consider switching to PO antibiotics and discharge home   03/19/2020 7:35 PM

## 2020-03-19 NOTE — Progress Notes (Signed)
Report given to [facility] on Mother and baby. Patient covid swabbed prior to transfer.

## 2020-03-20 MED ORDER — LEVOTHYROXINE SODIUM 125 MCG PO TABS: 125.0000 ug | ORAL_TABLET | Freq: Every day | ORAL | 0 refills | Status: DC

## 2020-03-20 MED ORDER — BREAST MILK/FORMULA (FOR LABEL PRINTING ONLY): ORAL | Status: DC

## 2020-03-20 NOTE — Progress Notes (Signed)
Pt. D/C'd to home. Alert and oriented with aprop. Affect, VS and assessment WNL. V/O of D/C Instructions and F/U appointment. Instructed to notify M.D. if Abdominal pain returns and/or if she has a temperature of 100.5 or greater. Also, instructed Pt. To notify M.D. of any foul smelling vaginal discharge and Pt. V/O. Pt. Taken downstairs to family who is driving Pt. Home.

## 2020-03-20 NOTE — H&P (Signed)
History and Physical   SERVICE: Gynecology   Patient Name: Hannah Maldonado Patient MRN:   [id number]  CC: Abdominal pain   HPI: Hannah Maldonado is a 34 y.o. G1P1001 who presents to the ED for evaluation of abdominal pain.  She had an uncomplicated vaginal birth with a 2nd degree laceration and repair on 02/27/2020.  She was positive for GBS and adequately treated.  She had been feeling well up until yesterday when she developed fever, chills, and lower abdominal cramping.  Reports that pain is intermittent.  Vaginal bleeding has improved since her delivery and is currently having light spotting.  She denies any change in discharge or odor.     Review of Systems:  Review of Systems  Constitutional: Positive for chills, fever and malaise/fatigue.  Respiratory: Negative for cough and shortness of breath.   Cardiovascular: Negative for chest pain.  Gastrointestinal: Positive for abdominal pain. Negative for nausea and vomiting.  Genitourinary: Negative for dysuria, flank pain and frequency.  Musculoskeletal: Negative for back pain and myalgias.  Skin: Negative for rash.  Neurological: Negative for dizziness and headaches.  Psychiatric/Behavioral: The patient is not nervous/anxious.     Past Obstetrical History:         OB History    Gravida  1   Para  1   Term  1   Preterm      AB      Living  1     SAB      TAB      Ectopic      Multiple  0   Live Births  1           Past Medical History:     Past Medical History:  Diagnosis Date  . Anxiety   . Depression   . Hypothyroidism   . Thyroid disease     Past Surgical History:        Past Surgical History:  Procedure Laterality Date  . THYROIDECTOMY      Family History:  Family history reviewed, no pertinent history, denies history of gyn cancers   Social History:   reports that she has never smoked. She has never used smokeless tobacco. She reports previous  alcohol use. She reports that she does not use drugs.  Home Medications:  Medications reconciled in EPIC  No current facility-administered medications on file prior to encounter.         Current Outpatient Medications on File Prior to Encounter  Medication Sig Dispense Refill  . levothyroxine (SYNTHROID) 150 MCG tablet Take 150 mcg by mouth daily before breakfast.    . Prenatal Vit-Fe Fumarate-FA (PRENATAL MULTIVITAMIN) TABS tablet Take 1 tablet by mouth daily at 12 noon.    Marland Kitchen acetaminophen (TYLENOL) 325 MG tablet Take 2 tablets (650 mg total) by mouth every 4 (four) hours as needed (for pain scale < 4).    . benzocaine-Menthol (DERMOPLAST) 20-0.5 % AERO Apply 1 application topically as needed for irritation (perineal discomfort). (Patient not taking: Reported on 03/18/2020)    . coconut oil OIL Apply 1 application topically as needed.  0  . ibuprofen (ADVIL) 600 MG tablet Take 1 tablet (600 mg total) by mouth every 6 (six) hours as needed for mild pain or moderate pain. 30 tablet 0  . senna-docusate (SENOKOT-S) 8.6-50 MG tablet Take 2 tablets by mouth daily as needed for mild constipation.      Allergies:       Allergies  Allergen Reactions  . Torecan [  Thiethylperazine] Other (See Comments)    Muscle spasms    Physical Exam:  Temp:  [102.5 F (39.2 C)] 102.5 F (39.2 C) (07/29 1501) Pulse Rate:  [122] 122 (07/29 1501) Resp:  [20] 20 (07/29 1501) BP: (116)/(85) 116/85 (07/29 1501) SpO2:  [94 %] 94 % (07/29 1501) Weight:  [75.3 kg] 75.3 kg (07/29 1501)  Physical Exam Constitutional:      Appearance: Normal appearance.  Cardiovascular:     Rate and Rhythm: Normal rate and regular rhythm.     Heart sounds: Normal heart sounds.  Pulmonary:     Effort: Pulmonary effort is normal.     Breath sounds: Normal breath sounds.  Abdominal:     General: Abdomen is flat. Bowel sounds are normal.     Palpations: Abdomen is soft. There is no mass.     Tenderness: There  is abdominal tenderness (uterine tenderness). There is no rebound.  Genitourinary:    General: Normal vulva.     Vagina: Normal.     Cervix: No cervical motion tenderness.     Uterus: Tender.      Adnexa: Right adnexa normal and left adnexa normal.  Musculoskeletal:        General: Normal range of motion.  Skin:    General: Skin is warm and dry.     Capillary Refill: Capillary refill takes 2 to 3 seconds.  Neurological:     Mental Status: She is alert and oriented to person, place, and time.  Psychiatric:        Mood and Affect: Mood normal.     Labs/Studies:   CBC and Coags:        Recent Labs       Lab Results  Component Value Date   WBC 15.1 (H) 03/18/2020   NEUTOPHILPCT 84 03/18/2020   EOSPCT 0 03/18/2020   BASOPCT 0 03/18/2020   LYMPHOPCT 10 03/18/2020   HGB 14.0 03/18/2020   HCT 40.7 03/18/2020   MCV 90.6 03/18/2020   PLT 253 03/18/2020     CMP:    Recent Labs       Lab Results  Component Value Date   NA 136 03/18/2020   K 3.8 03/18/2020   CL 104 03/18/2020   CO2 21 (L) 03/18/2020   BUN 16 03/18/2020   CREATININE 0.76 03/18/2020   CREATININE 0.75 07/14/2019   PROT 7.8 03/18/2020   BILITOT 1.0 03/18/2020   ALT 26 03/18/2020   AST 25 03/18/2020   ALKPHOS 66 03/18/2020     Other Imaging:  Imaging Results  DG Chest 2 View  Result Date: 03/18/2020 CLINICAL DATA:  Three weeks postpartum with sepsis. EXAM: CHEST - 2 VIEW COMPARISON:  None. FINDINGS: The heart size and mediastinal contours are within normal limits. Both lungs are clear. The visualized skeletal structures are unremarkable. IMPRESSION: No active cardiopulmonary disease. Electronically Signed   By: Romona Curls M.D.   On: 03/18/2020 16:13   US PELVIS (TRANSABDOMINAL ONLY)  Result Date: 03/18/2020 CLINICAL DATA:  The patient is 3 weeks postpartum and has fever. Concern for endometritis, retained products of conception. EXAM: TRANSABDOMINAL ULTRASOUND OF PELVIS  DOPPLER ULTRASOUND OF OVARIES TECHNIQUE: Transabdominal ultrasound examination of the pelvis was performed including evaluation of the uterus, ovaries, adnexal regions, and pelvic cul-de-sac. Color and duplex Doppler ultrasound was utilized to evaluate blood flow to the ovaries. COMPARISON:  Ob ultrasound dated 11/10/2019. FINDINGS: Uterus Measurements: 9.4 x 6.2 x 8.3 cm = volume: 252 mL. No fibroids or other  mass visualized. Endometrium Thickness: 2.7 cm. No focal abnormality visualized. No significant hyperemia or definite evidence of retained products of conception. No definite intrauterine gas. Right ovary Measurements: 3.9 x 2.2 x 3.5 cm = volume: 16 mL. Two adnexal cysts are noted, measuring 5.9 x 3.4 x 5.4 cm and 3.5 x 2.9 x 2.4 cm. Left ovary Measurements: 3.6 x 3.3 x 2.5 cm = volume: 15 mL. Normal appearance/no adnexal mass. Pulsed Doppler evaluation demonstrates normal low-resistance arterial and venous waveforms in both ovaries. Other: There is trace free fluid in the pelvis. IMPRESSION: Thickened endometrium likely represents endometritis. Electronically Signed   By: Romona Curls M.D.   On: 03/18/2020 17:19   US PELVIC DOPPLER (TORSION R/O OR MASS ARTERIAL FLOW)  Result Date: 03/18/2020 CLINICAL DATA:  The patient is 3 weeks postpartum and has fever. Concern for endometritis, retained products of conception. EXAM: TRANSABDOMINAL ULTRASOUND OF PELVIS DOPPLER ULTRASOUND OF OVARIES TECHNIQUE: Transabdominal ultrasound examination of the pelvis was performed including evaluation of the uterus, ovaries, adnexal regions, and pelvic cul-de-sac. Color and duplex Doppler ultrasound was utilized to evaluate blood flow to the ovaries. COMPARISON:  Ob ultrasound dated 11/10/2019. FINDINGS: Uterus Measurements: 9.4 x 6.2 x 8.3 cm = volume: 252 mL. No fibroids or other mass visualized. Endometrium Thickness: 2.7 cm. No focal abnormality visualized. No significant hyperemia or definite evidence of retained  products of conception. No definite intrauterine gas. Right ovary Measurements: 3.9 x 2.2 x 3.5 cm = volume: 16 mL. Two adnexal cysts are noted, measuring 5.9 x 3.4 x 5.4 cm and 3.5 x 2.9 x 2.4 cm. Left ovary Measurements: 3.6 x 3.3 x 2.5 cm = volume: 15 mL. Normal appearance/no adnexal mass. Pulsed Doppler evaluation demonstrates normal low-resistance arterial and venous waveforms in both ovaries. Other: There is trace free fluid in the pelvis. IMPRESSION: Thickened endometrium likely represents endometritis. Electronically Signed   By: Romona Curls M.D.   On: 03/18/2020 17:19      Assessment / Plan:   Hannah Maldonado is a 34 y.o. G1P1001 who presents with lower abdominal pain.     1. Exam consistent with postpartum endometritis.  Fundus is tender to palpation.  Her vaginal birth was uncomplicated and she was adequately treated for GBS.   2. Admit inpatient for IV antibiotics for postpartum endometritis.   3. Right ovarian cyst noted on [place] - will monitor for worsening abdominal pain.  4. Lactation consult PRN breastfeeding/pumping  5. Repeat CBC in AM  6. Aerobic and anaerobic cultures pending  7. Plan of care and admission discussed with Dr. Tiburcio Pea.    Thank you for the opportunity to be involved with this patient's care.  ----- Margaretmary Eddy, CNM Midwife [facility], Department of OB/GYN [facility]

## 2020-03-20 NOTE — Discharge Summary (Signed)
GynecologicalDischarge Summary  Patient Name: Hannah Maldonado DOB: [birth year] MRN: [id number]  Date of Admission: 03/18/2020 Date of Discharge: 03/20/2020  Hospital course:  Admitted on 03/18/20 after developing developed fever, chills, and lower abdominal cramping that started on 7/28. She was s/p NSVD on 02/27/20, GBS positive that was adequately treated and had an unremarkable postpartum course. She was treated with IV antibiotics, Gentamicin and Unasyn. Blood cultures negative >24hrs. No leukocytosis on CBC 7/30.   Patient was felt to be stable for discharge on hospital day 2 when she was tolerating a regular diet, pain was controlled with po pain medications, and she was ambulating and voiding without difficulty. Vital signs were stable and physical exam remained benign throughout her hospital stay.  She remained afebrile since 7/30 at 0050. She will follow up at [facility] in 2 weeks.  Per consult with Dr Tiburcio Pea, pt does not need to dc home with PO antibiotics.   She was given specific instructions and numbers to call in written and verbal format. She verbalized understanding, agrees with the plan of care, and all questions answered to her satisfaction.  Discharge Physical Exam:  BP (!) 117/88 (BP Location: Left Arm)   Pulse 72   Temp 98.1 F (36.7 C) (Oral)   Resp 20   Ht 5\' 2"  (1.575 m)   Wt 75.3 kg   SpO2 97% Comment: Room Air  BMI 30.36 kg/m   General: NAD CV: RRR Pulm: CTABL, nl effort ABD: s/nd/nt Fundus: nontender, at [facility] symphysis DVT Evaluation: LE non-ttp, no evidence of DVT on exam.  Hemoglobin  Date Value Ref Range Status  03/19/2020 13.1 12.0 - 15.0 g/dL Final   HCT  Date Value Ref Range Status  03/19/2020 39.5 36 - 46 % Final      Plan:  Hannah Maldonado was discharged to home in good condition. Follow-up appointment at [facility] in 2 weeks   Discharge Medications: Allergies as of 03/20/2020      Reactions   Torecan  [thiethylperazine] Other (See Comments)   Muscle spasms      Medication List    TAKE these medications   acetaminophen 325 MG tablet Commonly known as: Tylenol Take 2 tablets (650 mg total) by mouth every 4 (four) hours as needed (for pain scale < 4).   benzocaine-Menthol 20-0.5 % Aero Commonly known as: DERMOPLAST Apply 1 application topically as needed for irritation (perineal discomfort).   coconut oil Oil Apply 1 application topically as needed.   ibuprofen 600 MG tablet Commonly known as: ADVIL Take 1 tablet (600 mg total) by mouth every 6 (six) hours as needed for mild pain or moderate pain.   levothyroxine 125 MCG tablet Commonly known as: SYNTHROID Take 1 tablet (125 mcg total) by mouth daily before breakfast. Start taking on: March 21, 2020 What changed:   medication strength  how much to take   prenatal multivitamin Tabs tablet Take 1 tablet by mouth daily at 12 noon.   senna-docusate 8.6-50 MG tablet Commonly known as: Senokot-S Take 2 tablets by mouth daily as needed for mild constipation.        Follow-up Information    [facility]. Schedule an appointment as soon as possible for a visit in 2 week(s).   Why: Postpartum care and f/u Contact information: [address] [place] [address]              Signed: [phone], CNM 9:52 AM

## 2020-03-22 ENCOUNTER — Other Ambulatory Visit: Payer: Self-pay

## 2020-03-22 ENCOUNTER — Observation Stay
Admission: EM | Admit: 2020-03-22 | Discharge: 2020-03-24 | Disposition: A | Discharge status: Home / Self Care | Payer: Medicaid Other | Attending: General Surgery | Admitting: General Surgery

## 2020-03-22 DIAGNOSIS — Z20822 Contact with and (suspected) exposure to covid-19: Secondary | ICD-10-CM | POA: Insufficient documentation

## 2020-03-22 DIAGNOSIS — K810 Acute cholecystitis: Principal | ICD-10-CM | POA: Insufficient documentation

## 2020-03-22 DIAGNOSIS — Z79899 Other long term (current) drug therapy: Secondary | ICD-10-CM | POA: Diagnosis not present

## 2020-03-22 DIAGNOSIS — R1011 Right upper quadrant pain: Secondary | ICD-10-CM | POA: Diagnosis present

## 2020-03-22 DIAGNOSIS — K819 Cholecystitis, unspecified: Secondary | ICD-10-CM

## 2020-03-22 DIAGNOSIS — E039 Hypothyroidism, unspecified: Secondary | ICD-10-CM | POA: Diagnosis not present

## 2020-03-22 DIAGNOSIS — W19XXXA Unspecified fall, initial encounter: Secondary | ICD-10-CM

## 2020-03-22 DIAGNOSIS — R1013 Epigastric pain: Secondary | ICD-10-CM

## 2020-03-22 LAB — COMPREHENSIVE METABOLIC PANEL
ALT: 39 U/L (ref 0–44)
AST: 43 U/L — ABNORMAL HIGH (ref 15–41)
Albumin: 4.4 g/dL (ref 3.5–5.0)
Alkaline Phosphatase: 77 U/L (ref 38–126)
Anion gap: 12 (ref 5–15)
BUN: 21 mg/dL — ABNORMAL HIGH (ref 6–20)
CO2: 25 mmol/L (ref 22–32)
Calcium: 9.4 mg/dL (ref 8.9–10.3)
Chloride: 102 mmol/L (ref 98–111)
Creatinine, Ser: 0.85 mg/dL (ref 0.44–1.00)
GFR calc Af Amer: >60 mL/min (ref >60)
GFR calc non Af Amer: >60 mL/min (ref >60)
Glucose, Bld: 109 mg/dL — ABNORMAL HIGH (ref 70–99)
Potassium: 4.1 mmol/L (ref 3.5–5.1)
Sodium: 139 mmol/L (ref 135–145)
Total Bilirubin: 0.7 mg/dL (ref 0.3–1.2)
Total Protein: 8 g/dL (ref 6.5–8.1)

## 2020-03-22 LAB — CBC
HCT: 42.1 % (ref 36.0–46.0)
Hemoglobin: 14 g/dL (ref 12.0–15.0)
MCH: 30.4 pg (ref 26.0–34.0)
MCHC: 33.3 g/dL (ref 30.0–36.0)
MCV: 91.5 fL (ref 80.0–100.0)
Platelets: 275 10*3/uL (ref 150–400)
RBC: 4.6 MIL/uL (ref 3.87–5.11)
RDW: 12.5 % (ref 11.5–15.5)
WBC: 7.2 10*3/uL (ref 4.0–10.5)
nRBC: 0 % (ref 0.0–0.2)

## 2020-03-22 LAB — LIPASE, BLOOD: Lipase: 76 U/L — ABNORMAL HIGH (ref 11–51)

## 2020-03-22 MED ORDER — SODIUM CHLORIDE 0.9 % IV BOLUS
1000.0000 mL | Freq: Once | INTRAVENOUS | Status: AC
  Administered 2020-03-22: 1000 mL via INTRAVENOUS

## 2020-03-22 NOTE — ED Triage Notes (Signed)
Pt comes to ED from home due to pain that started on right side of abdomen and felt like it was hard to breath. Pt states she took her BP at home and it was elevated. Pt reports that she gave birth 3 weeks ago and was also recently discharged from the hospital following an abdominal infection 3 days ago. Pt is ambulatory but is holding her abdomen in pain.

## 2020-03-22 NOTE — ED Notes (Signed)
Pt reports being discharged from hospital Saturday w uterus infection after delivering vaginally approx 3 weeks ago. Pt states "I felt cramping in my stomach then sweaty so I checked my blood pressure and it was 150s/90s. I just didn't know what to take for it because I'm breastfeeding. But it feels a lot better now, no cramping now."

## 2020-03-22 NOTE — ED Provider Notes (Signed)
[facility] Emergency Department Provider Note    First MD Initiated Contact with Patient 03/22/20 2255     (approximate)  I have reviewed the triage vital signs and the nursing notes.   HISTORY  Chief Complaint Abdominal Pain    HPI Hannah Maldonado is a 34 y.o. female below listed past medical history 3 weeks postpartum status post uncomplicated delivery but with recent admission to hospital for postpartum endometritis discharged home on oral antibiotics presents to the ER with an episode of right upper quadrant abdominal pain associate with nausea.  Has not been having any fevers.  States that the symptoms for 2 or endometritis are significantly improving.  She is not had any abdominal surgeries.  Denies any chest pain or pressure.  No pain radiating through to her back.  Has had some mild nausea.  Denies any dysuria or flank pain.    Past Medical History:  Diagnosis Date  . Anxiety   . Depression   . Hypothyroidism   . Thyroid disease    History reviewed. No pertinent family history. Past Surgical History:  Procedure Laterality Date  . THYROIDECTOMY     Patient Active Problem List   Diagnosis Date Noted  . Acute cholecystitis 03/23/2020  . Postpartum endometritis 03/18/2020  . Labor and delivery indication for care or intervention 02/26/2020      Prior to Admission medications   Medication Sig Start Date End Date Taking? Authorizing Provider  levothyroxine (SYNTHROID) 125 MCG tablet Take 1 tablet (125 mcg total) by mouth daily before breakfast. 03/21/20  Yes McVey, Prudencio Pair, CNM  Prenatal Vit-Fe Fumarate-FA (PRENATAL MULTIVITAMIN) TABS tablet Take 1 tablet by mouth daily at 12 noon.   Yes [provider]  acetaminophen (TYLENOL) 325 MG tablet Take 2 tablets (650 mg total) by mouth every 4 (four) hours as needed (for pain scale < 4). 02/28/20   Gustavo Lah, CNM  benzocaine-Menthol (DERMOPLAST) 20-0.5 % AERO Apply 1  application topically as needed for irritation (perineal discomfort). Patient not taking: Reported on 03/18/2020 02/28/20   Gustavo Lah, CNM  coconut oil OIL Apply 1 application topically as needed. 02/28/20   Gustavo Lah, CNM  ibuprofen (ADVIL) 600 MG tablet Take 1 tablet (600 mg total) by mouth every 6 (six) hours as needed for mild pain or moderate pain. 02/28/20   Gustavo Lah, CNM  senna-docusate (SENOKOT-S) 8.6-50 MG tablet Take 2 tablets by mouth daily as needed for mild constipation. 02/28/20   Gustavo Lah, CNM    Allergies Torecan [thiethylperazine]    Social History Social History   Tobacco Use  . Smoking status: Never Smoker  . Smokeless tobacco: Never Used  Substance Use Topics  . Alcohol use: Not Currently  . Drug use: Never    Review of Systems Patient denies headaches, rhinorrhea, blurry vision, numbness, shortness of breath, chest pain, edema, cough, abdominal pain, nausea, vomiting, diarrhea, dysuria, fevers, rashes or hallucinations unless otherwise stated above in HPI. ____________________________________________   PHYSICAL EXAM:  VITAL SIGNS: Vitals:   03/23/20 0115 03/23/20 0245  BP: 138/86 122/87  Pulse:  (!) 56  Resp: 12 14  Temp:    SpO2: 100% 98%    Constitutional: Alert and oriented.  Eyes: Conjunctivae are normal.  Head: Atraumatic. Nose: No congestion/rhinnorhea. Mouth/Throat: Mucous membranes are moist.   Neck: No stridor. Painless ROM.  Cardiovascular: Normal rate, regular rhythm. Grossly normal heart sounds.  Good peripheral circulation. Respiratory: Normal respiratory effort.  No  retractions. Lungs CTAB. Gastrointestinal: Soft and nontender in all 4 quadrants. No distention. No abdominal bruits. No CVA tenderness. Genitourinary: Deferred Musculoskeletal: No lower extremity tenderness nor edema.  No joint effusions. Neurologic:  Normal speech and language. No gross focal neurologic deficits are appreciated. No facial droop Skin:   Skin is warm, dry and intact. No rash noted. Psychiatric: Mood and affect are normal. Speech and behavior are normal.  ____________________________________________   LABS (all labs ordered are listed, but only abnormal results are displayed)  Results for orders placed or performed during the hospital encounter of 03/22/20 (from the past 24 hour(s))  CBC     Status: None   Collection Time: 03/22/20 10:20 PM  Result Value Ref Range   WBC 7.2 4.0 - 10.5 K/uL   RBC 4.60 3.87 - 5.11 MIL/uL   Hemoglobin 14.0 12.0 - 15.0 g/dL   HCT 94.7 36 - 46 %   MCV 91.5 80.0 - 100.0 fL   MCH 30.4 26.0 - 34.0 pg   MCHC 33.3 30.0 - 36.0 g/dL   RDW 65.4 65.0 - 35.4 %   Platelets 275 150 - 400 K/uL   nRBC 0.0 0.0 - 0.2 %  Comprehensive metabolic panel     Status: Abnormal   Collection Time: 03/22/20 10:20 PM  Result Value Ref Range   Sodium 139 135 - 145 mmol/L   Potassium 4.1 3.5 - 5.1 mmol/L   Chloride 102 98 - 111 mmol/L   CO2 25 22 - 32 mmol/L   Glucose, Bld 109 (H) 70 - 99 mg/dL   BUN 21 (H) 6 - 20 mg/dL   Creatinine, Ser 6.56 0.44 - 1.00 mg/dL   Calcium 9.4 8.9 - 81.2 mg/dL   Total Protein 8.0 6.5 - 8.1 g/dL   Albumin 4.4 3.5 - 5.0 g/dL   AST 43 (H) 15 - 41 U/L   ALT 39 0 - 44 U/L   Alkaline Phosphatase 77 38 - 126 U/L   Total Bilirubin 0.7 0.3 - 1.2 mg/dL   GFR calc non Af Amer >60 >60 mL/min   GFR calc Af Amer >60 >60 mL/min   Anion gap 12 5 - 15  Lipase, blood     Status: Abnormal   Collection Time: 03/22/20 10:20 PM  Result Value Ref Range   Lipase 76 (H) 11 - 51 U/L   ____________________________________________  EKG My review and personal interpretation at Time: 22:16   Indication: abd pain  Rate: 60  Rhythm: sinus Axis: normal Other: normal intervals, no stemi ____________________________________________  RADIOLOGY  I personally reviewed all radiographic images ordered to evaluate for the above acute complaints and reviewed radiology reports and findings.  These  findings were personally discussed with the patient.  Please see medical record for radiology report.  ____________________________________________   PROCEDURES  Procedure(s) performed:  Procedures    Critical Care performed: no ____________________________________________   INITIAL IMPRESSION / ASSESSMENT AND PLAN / ED COURSE  Pertinent labs & imaging results that were available during my care of the patient were reviewed by me and considered in my medical decision making (see chart for details).   DDX: enteritis, gastritis, cholelithiasis, cholecystitis, pancreatitis  Hannah Maldonado is a 34 y.o. who presents to the ED with symptoms as described above.  Patient arrives pain-free.  Blood work shows decreasing white count since admission. Does not appear c/w retained POC or worsening endometritis Does have mild increase in her lipase with a bump in her AST will give some IV  fluids this may be secondary to dehydration but given location of pain will order ultrasound.  Based on her abdominal exam do not feel that CT imaging is clinically indicated.  Will give IV fluids.  Clinical Course as of Mar 23 254  Tue Mar 23, 2020  0203 Ultrasound is concerning for cholecystitis given the patient's pain and description of symptoms.  I discussed case with Dr. Maia Plan general surgery who will evaluate the patient.   [PR]  [phone] Patient evaluated at bedside by general surgery plans to admit for further work-up and management. Remains hemodynamically stable.   [PR]    Clinical Course User Index [PR] Willy Eddy, MD    The patient was evaluated in Emergency Department today for the symptoms described in the history of present illness. He/she was evaluated in the context of the global COVID-19 pandemic, which necessitated consideration that the patient might be at risk for infection with the SARS-CoV-2 virus that causes COVID-19. Institutional protocols and algorithms that pertain to  the evaluation of patients at risk for COVID-19 are in a state of rapid change based on information released by regulatory bodies including the CDC and federal and state organizations. These policies and algorithms were followed during the patient's care in the ED.  As part of my medical decision making, I reviewed the following data within the electronic MEDICAL RECORD NUMBER Nursing notes reviewed and incorporated, Labs reviewed, notes from prior ED visits and [place] Controlled Substance Database   ____________________________________________   FINAL CLINICAL IMPRESSION(S) / ED DIAGNOSES  Final diagnoses:  Epigastric pain  Cholecystitis      NEW MEDICATIONS STARTED DURING THIS VISIT:  New Prescriptions   No medications on file     Note:  This document was prepared using Dragon voice recognition software and may include unintentional dictation errors.    Willy Eddy, MD 03/23/20 [phone]

## 2020-03-23 ENCOUNTER — Observation Stay: Payer: Medicaid Other | Admitting: Certified Registered"

## 2020-03-23 ENCOUNTER — Encounter: Payer: Self-pay | Admitting: General Surgery

## 2020-03-23 ENCOUNTER — Emergency Department: Payer: Medicaid Other

## 2020-03-23 ENCOUNTER — Encounter
Admission: EM | Disposition: A | Discharge status: Home / Self Care | Payer: Self-pay | Source: Home / Self Care | Attending: Student in an Organized Health Care Education/Training Program

## 2020-03-23 DIAGNOSIS — K810 Acute cholecystitis: Secondary | ICD-10-CM | POA: Diagnosis present

## 2020-03-23 LAB — URINALYSIS, COMPLETE (UACMP) WITH MICROSCOPIC
Bacteria, UA: NONE SEEN
Bilirubin Urine: NEGATIVE
Glucose, UA: NEGATIVE mg/dL
Ketones, ur: NEGATIVE mg/dL
Nitrite: NEGATIVE
Protein, ur: NEGATIVE mg/dL
Specific Gravity, Urine: 1.011 (ref 1.005–1.030)
pH: 7 (ref 5.0–8.0)

## 2020-03-23 LAB — HEPATIC FUNCTION PANEL
ALT: 34 U/L (ref 0–44)
AST: 30 U/L (ref 15–41)
Albumin: 3.9 g/dL (ref 3.5–5.0)
Alkaline Phosphatase: 66 U/L (ref 38–126)
Bilirubin, Direct: <0.1 mg/dL (ref 0.0–0.2)
Total Bilirubin: 0.7 mg/dL (ref 0.3–1.2)
Total Protein: 7.1 g/dL (ref 6.5–8.1)

## 2020-03-23 LAB — SARS CORONAVIRUS 2 BY RT PCR (HOSPITAL ORDER, PERFORMED IN ~~LOC~~ HOSPITAL LAB): SARS Coronavirus 2: NEGATIVE

## 2020-03-23 LAB — MRSA PCR SCREENING: MRSA by PCR: NEGATIVE

## 2020-03-23 LAB — LIPASE, BLOOD: Lipase: 56 U/L — ABNORMAL HIGH (ref 11–51)

## 2020-03-23 LAB — HIV ANTIBODY (ROUTINE TESTING W REFLEX): HIV Screen 4th Generation wRfx: NONREACTIVE

## 2020-03-23 LAB — CULTURE, BLOOD (ROUTINE X 2)
Culture: NO GROWTH
Culture: NO GROWTH
Special Requests: ADEQUATE

## 2020-03-23 SURGERY — CHOLECYSTECTOMY, ROBOT-ASSISTED, LAPAROSCOPIC
Anesthesia: General

## 2020-03-23 MED ORDER — SODIUM CHLORIDE 0.9 % IV SOLN: INTRAVENOUS | Status: DC

## 2020-03-23 MED ORDER — ONDANSETRON HCL 4 MG/2ML IJ SOLN: 4.0000 mg | Freq: Once | INTRAMUSCULAR | Status: DC | PRN

## 2020-03-23 MED ORDER — SEVOFLURANE IN SOLN
RESPIRATORY_TRACT | Status: AC
  Filled 2020-03-23: qty 250

## 2020-03-23 MED ORDER — ACETAMINOPHEN 10 MG/ML IV SOLN
INTRAVENOUS | Status: DC | PRN
  Administered 2020-03-23: 1000 mg via INTRAVENOUS

## 2020-03-23 MED ORDER — ROCURONIUM BROMIDE 100 MG/10ML IV SOLN
INTRAVENOUS | Status: DC | PRN
  Administered 2020-03-23 (×2): 10 mg via INTRAVENOUS
  Administered 2020-03-23: 50 mg via INTRAVENOUS
  Administered 2020-03-23: 10 mg via INTRAVENOUS

## 2020-03-23 MED ORDER — FENTANYL CITRATE (PF) 100 MCG/2ML IJ SOLN
INTRAMUSCULAR | Status: AC
  Administered 2020-03-23: 25 ug via INTRAVENOUS
  Filled 2020-03-23: qty 2

## 2020-03-23 MED ORDER — PIPERACILLIN-TAZOBACTAM 3.375 G IVPB
3.3750 g | Freq: Three times a day (TID) | INTRAVENOUS | Status: DC
  Administered 2020-03-23 – 2020-03-24 (×4): 3.375 g via INTRAVENOUS
  Filled 2020-03-23 (×4): qty 50

## 2020-03-23 MED ORDER — SENNOSIDES-DOCUSATE SODIUM 8.6-50 MG PO TABS: 2.0000 | ORAL_TABLET | Freq: Every day | ORAL | Status: DC | PRN

## 2020-03-23 MED ORDER — METRONIDAZOLE IN NACL 5-0.79 MG/ML-% IV SOLN: 500.0000 mg | Freq: Once | INTRAVENOUS | Status: DC

## 2020-03-23 MED ORDER — LIDOCAINE HCL (CARDIAC) PF 100 MG/5ML IV SOSY
PREFILLED_SYRINGE | INTRAVENOUS | Status: DC | PRN
  Administered 2020-03-23: 80 mg via INTRAVENOUS

## 2020-03-23 MED ORDER — MIDAZOLAM HCL 2 MG/2ML IJ SOLN
INTRAMUSCULAR | Status: AC
  Filled 2020-03-23: qty 2

## 2020-03-23 MED ORDER — MORPHINE SULFATE (PF) 4 MG/ML IV SOLN
4.0000 mg | INTRAVENOUS | Status: DC | PRN
  Administered 2020-03-24: 4 mg via INTRAVENOUS
  Filled 2020-03-23: qty 1

## 2020-03-23 MED ORDER — SODIUM CHLORIDE 0.9 % IV SOLN: 1.0000 g | Freq: Once | INTRAVENOUS | Status: DC

## 2020-03-23 MED ORDER — CHLORHEXIDINE GLUCONATE 0.12 % MT SOLN: 15.0000 mL | Freq: Once | OROMUCOSAL | Status: AC

## 2020-03-23 MED ORDER — INDOCYANINE GREEN 25 MG IV SOLR
1.2500 mg | Freq: Once | INTRAVENOUS | Status: AC
  Administered 2020-03-23: 1.25 mg via INTRAVENOUS
  Filled 2020-03-23: qty 10

## 2020-03-23 MED ORDER — ACETAMINOPHEN 650 MG RE SUPP: 650.0000 mg | Freq: Four times a day (QID) | RECTAL | Status: DC | PRN

## 2020-03-23 MED ORDER — PROPOFOL 10 MG/ML IV BOLUS
INTRAVENOUS | Status: AC
  Filled 2020-03-23: qty 40

## 2020-03-23 MED ORDER — FENTANYL CITRATE (PF) 100 MCG/2ML IJ SOLN
INTRAMUSCULAR | Status: AC
  Filled 2020-03-23: qty 2

## 2020-03-23 MED ORDER — CHLORHEXIDINE GLUCONATE 0.12 % MT SOLN
OROMUCOSAL | Status: AC
  Administered 2020-03-23: 15 mL via OROMUCOSAL
  Filled 2020-03-23: qty 15

## 2020-03-23 MED ORDER — ONDANSETRON 4 MG PO TBDP: 4.0000 mg | ORAL_TABLET | Freq: Four times a day (QID) | ORAL | Status: DC | PRN

## 2020-03-23 MED ORDER — BUPIVACAINE-EPINEPHRINE (PF) 0.25% -1:200000 IJ SOLN
INTRAMUSCULAR | Status: AC
  Filled 2020-03-23: qty 30

## 2020-03-23 MED ORDER — BUPIVACAINE-EPINEPHRINE 0.25% -1:200000 IJ SOLN
INTRAMUSCULAR | Status: DC | PRN
  Administered 2020-03-23: 14 mL
  Administered 2020-03-23: 16 mL

## 2020-03-23 MED ORDER — DEXAMETHASONE SODIUM PHOSPHATE 10 MG/ML IJ SOLN
INTRAMUSCULAR | Status: DC | PRN
  Administered 2020-03-23: 10 mg via INTRAVENOUS

## 2020-03-23 MED ORDER — "VISTASEAL 4 ML SINGLE DOSE KIT "
PACK | CUTANEOUS | Status: DC | PRN
  Administered 2020-03-23 (×2): 4 mL via TOPICAL

## 2020-03-23 MED ORDER — HYDROCODONE-ACETAMINOPHEN 5-325 MG PO TABS
1.0000 | ORAL_TABLET | ORAL | Status: DC | PRN
  Administered 2020-03-23: 1 via ORAL
  Filled 2020-03-23: qty 1
  Filled 2020-03-23 (×2): qty 2

## 2020-03-23 MED ORDER — ACETAMINOPHEN 10 MG/ML IV SOLN
INTRAVENOUS | Status: AC
  Filled 2020-03-23: qty 100

## 2020-03-23 MED ORDER — SODIUM CHLORIDE 0.9 % IV SOLN: Freq: Once | INTRAVENOUS | Status: DC

## 2020-03-23 MED ORDER — PROPOFOL 10 MG/ML IV BOLUS
INTRAVENOUS | Status: DC | PRN
  Administered 2020-03-23: 120 mg via INTRAVENOUS
  Administered 2020-03-23: 50 mg via INTRAVENOUS

## 2020-03-23 MED ORDER — MEPERIDINE HCL 50 MG/ML IJ SOLN: 6.2500 mg | INTRAMUSCULAR | Status: DC | PRN

## 2020-03-23 MED ORDER — PHENYLEPHRINE HCL (PRESSORS) 10 MG/ML IV SOLN
INTRAVENOUS | Status: DC | PRN
  Administered 2020-03-23 (×8): 100 ug via INTRAVENOUS

## 2020-03-23 MED ORDER — ONDANSETRON HCL 4 MG/2ML IJ SOLN
INTRAMUSCULAR | Status: DC | PRN
  Administered 2020-03-23: 4 mg via INTRAVENOUS

## 2020-03-23 MED ORDER — ENOXAPARIN SODIUM 40 MG/0.4ML ~~LOC~~ SOLN
40.0000 mg | SUBCUTANEOUS | Status: DC
  Administered 2020-03-23: 40 mg via SUBCUTANEOUS
  Filled 2020-03-23: qty 0.4

## 2020-03-23 MED ORDER — OXYCODONE HCL 5 MG PO TABS: 5.0000 mg | ORAL_TABLET | Freq: Once | ORAL | Status: DC | PRN

## 2020-03-23 MED ORDER — OXYCODONE HCL 5 MG/5ML PO SOLN: 5.0000 mg | Freq: Once | ORAL | Status: DC | PRN

## 2020-03-23 MED ORDER — HEMOSTATIC AGENTS (NO CHARGE) OPTIME
TOPICAL | Status: DC | PRN
  Administered 2020-03-23: 1 via TOPICAL

## 2020-03-23 MED ORDER — LEVOTHYROXINE SODIUM 50 MCG PO TABS
125.0000 ug | ORAL_TABLET | Freq: Every day | ORAL | Status: DC
  Administered 2020-03-23 – 2020-03-24 (×2): 125 ug via ORAL
  Filled 2020-03-23: qty 1
  Filled 2020-03-23: qty 3

## 2020-03-23 MED ORDER — ACETAMINOPHEN 325 MG PO TABS
650.0000 mg | ORAL_TABLET | Freq: Four times a day (QID) | ORAL | Status: DC | PRN
  Administered 2020-03-23 – 2020-03-24 (×2): 650 mg via ORAL
  Filled 2020-03-23 (×2): qty 2

## 2020-03-23 MED ORDER — ONDANSETRON HCL 4 MG/2ML IJ SOLN
4.0000 mg | Freq: Four times a day (QID) | INTRAMUSCULAR | Status: DC | PRN
  Administered 2020-03-23 (×2): 4 mg via INTRAVENOUS
  Filled 2020-03-23 (×2): qty 2

## 2020-03-23 MED ORDER — FENTANYL CITRATE (PF) 100 MCG/2ML IJ SOLN
25.0000 ug | INTRAMUSCULAR | Status: DC | PRN
  Administered 2020-03-23: 25 ug via INTRAVENOUS

## 2020-03-23 MED ORDER — FENTANYL CITRATE (PF) 100 MCG/2ML IJ SOLN
INTRAMUSCULAR | Status: DC | PRN
  Administered 2020-03-23: 25 ug via INTRAVENOUS
  Administered 2020-03-23: 50 ug via INTRAVENOUS

## 2020-03-23 MED ORDER — KETOROLAC TROMETHAMINE 30 MG/ML IJ SOLN
INTRAMUSCULAR | Status: AC
  Filled 2020-03-23: qty 1

## 2020-03-23 MED ORDER — KETOROLAC TROMETHAMINE 30 MG/ML IJ SOLN
INTRAMUSCULAR | Status: DC | PRN
  Administered 2020-03-23: 30 mg via INTRAVENOUS

## 2020-03-23 MED ORDER — MIDAZOLAM HCL 2 MG/2ML IJ SOLN
INTRAMUSCULAR | Status: DC | PRN
  Administered 2020-03-23: 2 mg via INTRAVENOUS

## 2020-03-23 SURGICAL SUPPLY — 61 items
APPLICATOR VISTASEAL 35 (MISCELLANEOUS) ×8 IMPLANT
BAG INFUSER PRESSURE 100CC (MISCELLANEOUS) ×4 IMPLANT
BLADE SURG SZ11 CARB STEEL (BLADE) ×4 IMPLANT
CANISTER SUCT 1200ML W/VALVE (MISCELLANEOUS) ×4 IMPLANT
CANNULA REDUC XI 12-8 STAPL (CANNULA) ×1
CANNULA REDUC XI 12-8MM STAPL (CANNULA) ×1
CANNULA REDUCER 12-8 DVNC XI (CANNULA) ×2 IMPLANT
CHLORAPREP W/TINT 26 (MISCELLANEOUS) ×4 IMPLANT
CLIP VESOLOCK MED LG 6/CT (CLIP) ×4 IMPLANT
COVER WAND RF STERILE (DRAPES) ×4 IMPLANT
DECANTER SPIKE VIAL GLASS SM (MISCELLANEOUS) IMPLANT
DEFOGGER SCOPE WARMER CLEARIFY (MISCELLANEOUS) ×4 IMPLANT
DERMABOND ADVANCED (GAUZE/BANDAGES/DRESSINGS) ×2
DERMABOND ADVANCED .7 DNX12 (GAUZE/BANDAGES/DRESSINGS) ×2 IMPLANT
DRAPE ARM DVNC X/XI (DISPOSABLE) ×8 IMPLANT
DRAPE COLUMN DVNC XI (DISPOSABLE) ×2 IMPLANT
DRAPE DA VINCI XI ARM (DISPOSABLE) ×8
DRAPE DA VINCI XI COLUMN (DISPOSABLE) ×2
ELECT REM PT RETURN 9FT ADLT (ELECTROSURGICAL) ×4
ELECTRODE REM PT RTRN 9FT ADLT (ELECTROSURGICAL) ×2 IMPLANT
GLOVE BIO SURGEON STRL SZ 6.5 (GLOVE) ×6 IMPLANT
GLOVE BIO SURGEONS STRL SZ 6.5 (GLOVE) ×2
GLOVE BIOGEL PI IND STRL 6.5 (GLOVE) ×4 IMPLANT
GLOVE BIOGEL PI INDICATOR 6.5 (GLOVE) ×4
GOWN STRL REUS W/ TWL LRG LVL3 (GOWN DISPOSABLE) ×6 IMPLANT
GOWN STRL REUS W/TWL LRG LVL3 (GOWN DISPOSABLE) ×6
GRASPER SUT TROCAR 14GX15 (MISCELLANEOUS) IMPLANT
HEMOSTAT SURGICEL 2X14 (HEMOSTASIS) ×4 IMPLANT
HEMOSTAT SURGICEL 2X3 (HEMOSTASIS) ×4 IMPLANT
IRRIGATION STRYKERFLOW (MISCELLANEOUS) ×2 IMPLANT
IRRIGATOR STRYKERFLOW (MISCELLANEOUS) ×4
IRRIGATOR SUCT 8 DISP DVNC XI (IRRIGATION / IRRIGATOR) ×2 IMPLANT
IRRIGATOR SUCTION 8MM XI DISP (IRRIGATION / IRRIGATOR) ×2
IV NS 1000ML (IV SOLUTION) ×2
IV NS 1000ML BAXH (IV SOLUTION) ×2 IMPLANT
KIT PINK PAD W/HEAD ARE REST (MISCELLANEOUS) ×4
KIT PINK PAD W/HEAD ARM REST (MISCELLANEOUS) ×2 IMPLANT
LABEL OR SOLS (LABEL) ×4 IMPLANT
NEEDLE HYPO 22GX1.5 SAFETY (NEEDLE) ×4 IMPLANT
NEEDLE INSUFFLATION 14GA 120MM (NEEDLE) ×4 IMPLANT
NS IRRIG 500ML POUR BTL (IV SOLUTION) ×4 IMPLANT
OBTURATOR OPTICAL STANDARD 8MM (TROCAR) ×2
OBTURATOR OPTICAL STND 8 DVNC (TROCAR) ×2
OBTURATOR OPTICALSTD 8 DVNC (TROCAR) ×2 IMPLANT
PACK LAP CHOLECYSTECTOMY (MISCELLANEOUS) ×4 IMPLANT
POUCH SPECIMEN RETRIEVAL 10MM (ENDOMECHANICALS) IMPLANT
SEAL CANN UNIV 5-8 DVNC XI (MISCELLANEOUS) ×6 IMPLANT
SEAL XI 5MM-8MM UNIVERSAL (MISCELLANEOUS) ×6
SET TUBE SMOKE EVAC HIGH FLOW (TUBING) ×4 IMPLANT
SOLUTION ELECTROLUBE (MISCELLANEOUS) ×4 IMPLANT
SPONGE LAP 4X18 RFD (DISPOSABLE) ×4 IMPLANT
STAPLER CANNULA SEAL DVNC XI (STAPLE) ×2 IMPLANT
STAPLER CANNULA SEAL XI (STAPLE) ×2
SUT MNCRL 4-0 (SUTURE) ×2
SUT MNCRL 4-0 27XMFL (SUTURE) ×2
SUT PROLENE 4 0 RB 1 (SUTURE) ×2
SUT PROLENE 4-0 RB1 .5 CRCL 36 (SUTURE) ×2 IMPLANT
SUT VIC AB 3-0 SH 27 (SUTURE)
SUT VIC AB 3-0 SH 27X BRD (SUTURE) IMPLANT
SUT VICRYL 0 AB UR-6 (SUTURE) IMPLANT
SUTURE MNCRL 4-0 27XMF (SUTURE) ×2 IMPLANT

## 2020-03-23 NOTE — Progress Notes (Signed)
IBCLC consult for patient's concerns about safety of medication and expressed human milk. Patient instructed that all milk collected at the present time was cleared for infant consumption based on current medications being L2 (likely compatible) but that she could pump and discard milk once if she preferred to directly after surgery and then resume pump schedule with milk stored in the special care nursery. Milk to be stored in the refrigerator up to 4-5 days following pumping but patient cautioned to freeze milk after that point and to use thawed milk within 24 hours. Cautioned to observe for excessive sleepiness and notify pediatrician following narcotic administration. Symptoms of maternal and infant thrush reviewed due to maternal antibiotic intake. Reporting occasional itching of nipple and areola after pumping but no redness or shiny appearance seen with breast assessment. Anticipating surgery at 1pm on 8/3 and discharge on 8/4.

## 2020-03-23 NOTE — Progress Notes (Signed)
Provided pt with breast pump, pads, and bottles. Full milk bottles were sent to mother baby for storage. Lactation nurse to follow later in this morning.

## 2020-03-23 NOTE — Anesthesia Procedure Notes (Signed)
Procedure Name: Intubation Date/Time: 03/23/2020 2:40 PM Performed by: Johnna Acosta, CRNA Pre-anesthesia Checklist: Patient identified, Emergency Drugs available, Suction available, Patient being monitored and Timeout performed Patient Re-evaluated:Patient Re-evaluated prior to induction Oxygen Delivery Method: Circle system utilized Preoxygenation: Pre-oxygenation with 100% oxygen Induction Type: IV induction Ventilation: Mask ventilation without difficulty Laryngoscope Size: Mac and 3 Tube type: Oral Tube size: 7.0 mm Number of attempts: 1 Airway Equipment and Method: Stylet Placement Confirmation: ETT inserted through vocal cords under direct vision,  positive ETCO2 and breath sounds checked- equal and bilateral Secured at: 21 cm Tube secured with: Tape Dental Injury: Teeth and Oropharynx as per pre-operative assessment  Comments: Done by A. [place]

## 2020-03-23 NOTE — Anesthesia Preprocedure Evaluation (Signed)
Anesthesia Evaluation  Patient identified by MRN, date of birth, ID band Patient awake    Reviewed: Allergy & Precautions, NPO status , Patient's Chart, lab work & pertinent test results  History of Anesthesia Complications Negative for: history of anesthetic complications  Airway Mallampati: II  TM Distance: >3 FB Neck ROM: Full    Dental  (+)    Pulmonary neg pulmonary ROS, neg sleep apnea, neg COPD,    breath sounds clear to auscultation- rhonchi (-) wheezing      Cardiovascular Exercise Tolerance: Good (-) hypertension(-) CAD, (-) Past MI, (-) Cardiac Stents and (-) CABG  Rhythm:Regular Rate:Normal - Systolic murmurs and - Diastolic murmurs    Neuro/Psych neg Seizures PSYCHIATRIC DISORDERS Anxiety Depression negative neurological ROS     GI/Hepatic negative GI ROS, Neg liver ROS,   Endo/Other  neg diabetesHypothyroidism   Renal/GU negative Renal ROS     Musculoskeletal negative musculoskeletal ROS (+)   Abdominal (+) - obese,   Peds  Hematology negative hematology ROS (+)   Anesthesia Other Findings Past Medical History: No date: Anxiety No date: Depression No date: Hypothyroidism No date: Thyroid disease   Reproductive/Obstetrics (+) Breast feeding                              Anesthesia Physical Anesthesia Plan  ASA: II  Anesthesia Plan: General   Post-op Pain Management:    Induction: Intravenous  PONV Risk Score and Plan: 2 and Dexamethasone, Ondansetron and Midazolam  Airway Management Planned: Oral ETT  Additional Equipment:   Intra-op Plan:   Post-operative Plan: Extubation in OR  Informed Consent: I have reviewed the patients History and Physical, chart, labs and discussed the procedure including the risks, benefits and alternatives for the proposed anesthesia with the patient or authorized representative who has indicated his/her understanding and  acceptance.     Dental advisory given  Plan Discussed with: CRNA and Anesthesiologist  Anesthesia Plan Comments:         Anesthesia Quick Evaluation

## 2020-03-23 NOTE — H&P (Signed)
SURGICAL CONSULTATION NOTE   HISTORY OF PRESENT ILLNESS (HPI):  34 y.o. female presented to [facility] ED for evaluation of abdominal pain since yesterday PM. Patient reports started with epigastric pain yesterday evening. She reported the pain radiates to the right upper quadrant. There has been no alleviating or aggravating factor. Patient reported that she was admitted recently for endometritis but this pain is completely different to the pain that she was having last week. She denies any fever or chills.  At the ED she had labs that shows no leukocytosis. Liver enzymes, alkaline phosphatase and bilirubin within normal limits. Lipase at 70. She also had ultrasound of the abdomen that shows gallbladder with thickening and pericholecystic fluid. There is also stones in the bladder. I personally evaluated the images.  Surgery is consulted by Dr. Roxan Hockey in this context for evaluation and management of acute cholecystitis.  PAST MEDICAL HISTORY (PMH):  Past Medical History:  Diagnosis Date  . Anxiety   . Depression   . Hypothyroidism   . Thyroid disease      PAST SURGICAL HISTORY (PSH):  Past Surgical History:  Procedure Laterality Date  . THYROIDECTOMY       MEDICATIONS:  Prior to Admission medications   Medication Sig Start Date End Date Taking? Authorizing Provider  acetaminophen (TYLENOL) 325 MG tablet Take 2 tablets (650 mg total) by mouth every 4 (four) hours as needed (for pain scale < 4). 02/28/20  Yes Gustavo Lah, CNM  coconut oil OIL Apply 1 application topically as needed. 02/28/20  Yes Gustavo Lah, CNM  ibuprofen (ADVIL) 600 MG tablet Take 1 tablet (600 mg total) by mouth every 6 (six) hours as needed for mild pain or moderate pain. 02/28/20  Yes Gustavo Lah, CNM  levothyroxine (SYNTHROID) 125 MCG tablet Take 1 tablet (125 mcg total) by mouth daily before breakfast. 03/21/20  Yes McVey, Prudencio Pair, CNM  Prenatal Vit-Fe Fumarate-FA (PRENATAL MULTIVITAMIN) TABS tablet Take 1  tablet by mouth daily at 12 noon.   Yes [provider]  senna-docusate (SENOKOT-S) 8.6-50 MG tablet Take 2 tablets by mouth daily as needed for mild constipation. 02/28/20  Yes Gustavo Lah, CNM  benzocaine-Menthol (DERMOPLAST) 20-0.5 % AERO Apply 1 application topically as needed for irritation (perineal discomfort). Patient not taking: Reported on 03/18/2020 02/28/20   Gustavo Lah, CNM     ALLERGIES:  Allergies  Allergen Reactions  . Torecan [Thiethylperazine] Other (See Comments)    Muscle spasms     SOCIAL HISTORY:  Social History   Socioeconomic History  . Marital status: Married    Spouse name: Not on file  . Number of children: Not on file  . Years of education: Not on file  . Highest education level: Not on file  Occupational History  . Not on file  Tobacco Use  . Smoking status: Never Smoker  . Smokeless tobacco: Never Used  Substance and Sexual Activity  . Alcohol use: Not Currently  . Drug use: Never  . Sexual activity: Not on file    Comment: pt will decide later, husband lives in [place]   Other Topics Concern  . Not on file  Social History Narrative  . Not on file   Social Determinants of Health   Financial Resource Strain:   . Difficulty of Paying Living Expenses:   Food Insecurity:   . Worried About Programme researcher, broadcasting/film/video in the Last Year:   . [employer] of Food in the Last Year:  Transportation Needs:   . Freight forwarder (Medical):   Marland Kitchen Lack of Transportation (Non-Medical):   Physical Activity:   . Days of Exercise per Week:   . Minutes of Exercise per Session:   Stress:   . Feeling of Stress :   Social Connections:   . Frequency of Communication with Friends and Family:   . Frequency of Social Gatherings with Friends and Family:   . Attends Religious Services:   . Active Member of Clubs or Organizations:   . Attends Banker Meetings:   Marland Kitchen Marital Status:   Intimate Partner Violence:   . Fear of Current or  Ex-Partner:   . Emotionally Abused:   Marland Kitchen Physically Abused:   . Sexually Abused:      FAMILY HISTORY:  History reviewed. No pertinent family history.   REVIEW OF SYSTEMS:  Constitutional: denies weight loss, fever, chills, or sweats  Eyes: denies any other vision changes, history of eye injury  ENT: denies sore throat, hearing problems  Respiratory: denies shortness of breath, wheezing  Cardiovascular: denies chest pain, palpitations  Gastrointestinal: positive abdominal pain, Denies nausea and vomitnig Genitourinary: denies burning with urination or urinary frequency Musculoskeletal: denies any other joint pains or cramps  Skin: denies any other rashes or skin discolorations  Neurological: denies any other headache, dizziness, weakness  Psychiatric: denies any other depression, anxiety   All other review of systems were negative   VITAL SIGNS:  Temp:  [98.1 F (36.7 C)] 98.1 F (36.7 C) (08/02 2200) Pulse Rate:  [55-68] 60 (08/03 0112) Resp:  [12-20] 12 (08/03 0115) BP: (97-138)/(60-88) 138/86 (08/03 0115) SpO2:  [95 %-100 %] 100 % (08/03 0115) Weight:  [68 kg] 68 kg (08/02 2200)     Height: 5\' 2"  (157.5 cm) Weight: 68 kg BMI (Calculated): 27.43   INTAKE/OUTPUT:  This shift: Total I/O In: 1000 [IV Piggyback:1000] Out: -   Last 2 shifts: @IOLAST2SHIFTS @   PHYSICAL EXAM:  Constitutional:  -- Normal body habitus  -- Awake, alert, and oriented x3  Eyes:  -- Pupils equally round and reactive to light  -- No scleral icterus  Ear, nose, and throat:  -- No jugular venous distension  Pulmonary:  -- No crackles  -- Equal breath sounds bilaterally -- Breathing non-labored at rest Cardiovascular:  -- S1, S2 present  -- No pericardial rubs Gastrointestinal:  -- Abdomen soft, tender in the right upper quadrant, non-distended, no guarding or rebound tenderness -- No abdominal masses appreciated, pulsatile or otherwise  Musculoskeletal and Integumentary:  -- Wounds: None  appreciated -- Extremities: B/L UE and LE FROM, hands and feet warm, no edema  Neurologic:  -- Motor function: intact and symmetric -- Sensation: intact and symmetric   Labs:  CBC Latest Ref Rng & Units 03/22/2020 03/19/2020 03/18/2020  WBC 4.0 - 10.5 K/uL 7.2 9.5 15.1(H)  Hemoglobin 12.0 - 15.0 g/dL 03/21/2020 03/20/2020 01.0  Hematocrit 36 - 46 % 42.1 39.5 40.7  Platelets 150 - 400 K/uL 275 215 253   CMP Latest Ref Rng & Units 03/22/2020 03/18/2020 07/14/2019  Glucose 70 - 99 mg/dL 03/20/2020) 94 95  BUN 6 - 20 mg/dL 07/16/2019) 16 8  Creatinine 0.44 - 1.00 mg/dL 644(I 34(V 4.25  Sodium 135 - 145 mmol/L 139 136 135  Potassium 3.5 - 5.1 mmol/L 4.1 3.8 3.5  Chloride 98 - 111 mmol/L 102 104 102  CO2 22 - 32 mmol/L 25 21(L) 22  Calcium 8.9 - 10.3 mg/dL 9.4 9.56) 9.7  Total Protein 6.5 - 8.1 g/dL 8.0 7.8 -  Total Bilirubin 0.3 - 1.2 mg/dL 0.7 1.0 -  Alkaline Phos 38 - 126 U/L 77 66 -  AST 15 - 41 U/L 43(H) 25 -  ALT 0 - 44 U/L 39 26 -    Imaging studies:  EXAM: ULTRASOUND ABDOMEN LIMITED RIGHT UPPER QUADRANT  COMPARISON:  None.  FINDINGS: Gallbladder:  Gallbladder is partially contracted. Layering sludge and small stones are present. Gallbladder wall thickening diffusely with small pericholecystic fluid. Murphy's sign is negative.  Common bile duct:  Diameter: 5 mm, normal  Liver:  No focal lesion identified. Within normal limits in parenchymal echogenicity. Portal vein is patent on color Doppler imaging with normal direction of blood flow towards the liver.  Other: Small amount of free fluid adjacent to the gallbladder.  IMPRESSION: Cholelithiasis with gallbladder wall thickening and pericholecystic fluid. Negative Murphy's sign. Changes are nonspecific but suspicious for cholecystitis in the appropriate clinical setting.   Electronically Signed   By: Burman Nieves M.D.   On: 03/23/2020 01:51  Assessment/Plan:  34 y.o. female with acute cholecystitis.  Patient with  history, physical exam and images consistent with acute cholecystitis. Patient oriented about diagnosis and surgical management as treatment.   Before proceeding to surgery we will repeat labs for assessment of lipase and liver enzymes. I will start with IV hydration and IV antibiotic therapy.  Discussed the risk of surgery including post-op infxn, seroma, biloma, chronic pain, poor-delayed wound healing, retained gallstone, conversion to open procedure, post-op SBO or ileus, and need for additional procedures to address said risks.  The risks of general anesthetic including MI, CVA, sudden death or even reaction to anesthetic medications also discussed. Alternatives include continued observation.  Benefits include possible symptom relief, prevention of complications including acute cholecystitis, pancreatitis.  Gae Gallop, MD

## 2020-03-23 NOTE — Interval H&P Note (Signed)
sHistory and Physical Interval Note:  03/23/2020 2:14 PM  Hannah Maldonado  has presented today for surgery, with the diagnosis of Acute Cholecystitis.  The various methods of treatment have been discussed with the patient and family. After consideration of risks, benefits and other options for treatment, the patient has consented to  Procedure(s): XI ROBOTIC ASSISTED LAPAROSCOPIC CHOLECYSTECTOMY (N/A) as a surgical intervention.  The patient's history has been reviewed, patient examined, no change in status, stable for surgery.  I have reviewed the patient's chart and labs.  Questions were answered to the patient's satisfaction.     Carolan Shiver

## 2020-03-23 NOTE — Transfer of Care (Signed)
Immediate Anesthesia Transfer of Care Note  Patient: Hannah Maldonado  Procedure(s) Performed: XI ROBOTIC ASSISTED LAPAROSCOPIC CHOLECYSTECTOMY (N/A ) INDOCYANINE GREEN FLUORESCENCE IMAGING (ICG)  Patient Location: PACU  Anesthesia Type:General  Level of Consciousness: awake and drowsy  Airway & Oxygen Therapy: Patient Spontanous Breathing and Patient connected to face mask oxygen  Post-op Assessment: Report given to RN and Post -op Vital signs reviewed and stable  Post vital signs: Reviewed and stable  Last Vitals:  Vitals Value Taken Time  BP 103/60 03/23/20 1715  Temp    Pulse 59 03/23/20 1719  Resp 18 03/23/20 1719  SpO2 100 % 03/23/20 1719  Vitals shown include unvalidated device data.  Last Pain:  Vitals:   03/23/20 1358  TempSrc: Temporal  PainSc: 0-No pain         Complications: No complications documented.

## 2020-03-23 NOTE — OR Nursing (Signed)
Verified with Dr Maia Plan ICG order bc patient is still nursing,  Dr Priscella Mann discussed with patient to discard milk for 12 hours.

## 2020-03-23 NOTE — Op Note (Signed)
Preoperative diagnosis: Acute cholecystitis  Postoperative diagnosis: Acute cholecystitis  Procedure: Robotic Assisted Laparoscopic Cholecystectomy.   Anesthesia: GETA   Surgeon: Dr. Hazle Quant  Wound Classification: Clean Contaminated  Indications: Patient is a 34 y.o. female developed right upper quadrant pain and on workup was found to have cholelithiasis with a normal common duct  And sign of cholecystitis. Robotic Assisted Laparoscopic cholecystectomy was elected.  Findings:  Critical view of safety achieved Cystic duct and artery identified, ligated and divided Adequate hemostasis  Description of procedure: The patient was placed on the operating table in the supine position. General anesthesia was induced. A time-out was completed verifying correct patient, procedure, site, positioning, and implant(s) and/or special equipment prior to beginning this procedure. An orogastric tube was placed. The abdomen was prepped and draped in the usual sterile fashion.  An incision was made in a natural skin line below the umbilicus.  The fascia was elevated and the Veress needle inserted. Proper position was confirmed by aspiration and saline meniscus test.  The abdomen was insufflated with carbon dioxide to a pressure of 15 mmHg. The patient tolerated insufflation well. A 8-mm trocar was then inserted in optiview fashion.  The laparoscope was inserted and the abdomen inspected. No injuries from initial trocar placement were noted. Additional trocars were then inserted in the following locations: an 8-mm trocar in the left lateral abdomen, and another two 8-mm trocars to the right side of the abdomen 5 cm appart. The umbilical trocar was changed to a 12 mm trocar all under direct visualization. The abdomen was inspected and no abnormalities were found. The table was placed in the reverse Trendelenburg position with the right side up. The robotic arms were docked and target anatomy identified.  Instrument inserted under direct visualization.  Filmy adhesions between the gallbladder and omentum, duodenum and transverse colon were lysed with electrocautery. The dome of the gallbladder was grasped with a prograsp and retracted over the dome of the liver. The infundibulum was also grasped with an atraumatic grasper and retracted toward the right lower quadrant. This maneuver exposed Calot's triangle. The peritoneum overlying the gallbladder infundibulum was then incised and the cystic duct and cystic artery identified and circumferentially dissected. Critical view of safety reviewed before ligating any structure. Firefly images taken to visualize biliary ducts. The cystic duct and cystic artery were then doubly clipped and divided close to the gallbladder.  The gallbladder was then dissected from its peritoneal attachments by electrocautery. Hemostasis was checked and the gallbladder and contained stones were removed using an endoscopic retrieval bag. The gallbladder was passed off the table as a specimen. A bleeder was identified lateral to the CBD. Attempt with surgicel and Evistaseal to achieve hemostasis was unsuccessful. A 4-0 prolene figure of 8 was placed on bleeder and bleeding was controlled.  The gallbladder fossa was copiously irrigated with saline and hemostasis was obtained. There was no evidence of bleeding from the gallbladder fossa or cystic artery or leakage of the bile from the cystic duct stump. Secondary trocars were removed under direct vision. No bleeding was noted. The robotic arms were undoked. The scope was withdrawn and the umbilical trocar removed. The abdomen was allowed to collapse. The fascia of the 42mm trocar sites was closed with figure-of-eight 0 vicryl sutures. The skin was closed with subcuticular sutures of 4-0 monocryl and topical skin adhesive. The orogastric tube was removed.  The patient tolerated the procedure well and was taken to the postanesthesia care unit in  stable condition.  Specimen: Gallbladder  Complications: None  EBL: 50 mL

## 2020-03-23 NOTE — Anesthesia Postprocedure Evaluation (Signed)
Anesthesia Post Note  Patient: Hannah Maldonado  Procedure(s) Performed: XI ROBOTIC ASSISTED LAPAROSCOPIC CHOLECYSTECTOMY (N/A ) INDOCYANINE GREEN FLUORESCENCE IMAGING (ICG)  Patient location during evaluation: PACU Anesthesia Type: General Level of consciousness: awake and alert Pain management: pain level controlled Vital Signs Assessment: post-procedure vital signs reviewed and stable Respiratory status: spontaneous breathing, nonlabored ventilation and respiratory function stable Cardiovascular status: blood pressure returned to baseline and stable Postop Assessment: no apparent nausea or vomiting Anesthetic complications: no   No complications documented.   Last Vitals:  Vitals:   03/23/20 1851 03/23/20 2014  BP: 114/76 116/75  Pulse: 60 66  Resp: 12 20  Temp:  36.5 C  SpO2: 98% 99%    Last Pain:  Vitals:   03/23/20 2014  TempSrc: Oral  PainSc:                  Karleen Hampshire

## 2020-03-23 NOTE — Progress Notes (Deleted)
Cross Cover Brief Note  Nurse reports patient arrival to floor with cervical collar in place and patient requesting cervical collar to be removed. No documentation noted in chart found nor could recent xray be located.  Patient states he has had multiple falls with the most recent 4 days prior to hospital admission.  He stated the collar was given to him at pain clinic in [place] and was told he needed to have followup after xray was taken. Patient is unaware of what xray showed.    Being he is asking for collar to be removed, will obtain xray to clear spine

## 2020-03-24 LAB — AEROBIC/ANAEROBIC CULTURE W GRAM STAIN (SURGICAL/DEEP WOUND)

## 2020-03-24 MED ORDER — HYDROCODONE-ACETAMINOPHEN 5-325 MG PO TABS: 1.0000 | ORAL_TABLET | ORAL | 0 refills | Status: AC | PRN

## 2020-03-24 MED ORDER — ONDANSETRON HCL 4 MG PO TABS: 4.0000 mg | ORAL_TABLET | Freq: Three times a day (TID) | ORAL | 0 refills | Status: DC | PRN

## 2020-03-24 MED ORDER — HYDROCODONE-ACETAMINOPHEN 5-325 MG PO TABS: 1.0000 | ORAL_TABLET | ORAL | 0 refills | Status: DC | PRN

## 2020-03-24 NOTE — Discharge Instructions (Signed)

## 2020-03-24 NOTE — Discharge Summary (Signed)
  Patient ID: Hannah Maldonado MRN: [id number] DOB/AGE: June 25, 1986 34 y.o.  Admit date: 03/22/2020 Discharge date: 03/24/2020   Discharge Diagnoses:  Active Problems:   Acute cholecystitis   Procedures: Robotic assisted laparoscopic cholecystectomy  Hospital Course: Patient with acute cholecystitis. Tolerated procedure well. Today patient is ambulating, tolerating diet. Wounds are dry and clean.   Physical Exam Constitutional:      Appearance: She is well-developed.  Cardiovascular:     Rate and Rhythm: Normal rate and regular rhythm.  Pulmonary:     Effort: Pulmonary effort is normal.     Breath sounds: Normal breath sounds.  Abdominal:     General: Abdomen is flat. Bowel sounds are normal.     Tenderness: There is no abdominal tenderness.  Neurological:     Mental Status: She is alert.    Consults: None  Disposition: Discharge disposition: 01-Home or Self Care       Discharge Instructions    Diet - low sodium heart healthy   Complete by: As directed    Increase activity slowly   Complete by: As directed      Allergies as of 03/24/2020      Reactions   Torecan [thiethylperazine] Other (See Comments)   Muscle spasms      Medication List    TAKE these medications   acetaminophen 325 MG tablet Commonly known as: Tylenol Take 2 tablets (650 mg total) by mouth every 4 (four) hours as needed (for pain scale < 4).   benzocaine-Menthol 20-0.5 % Aero Commonly known as: DERMOPLAST Apply 1 application topically as needed for irritation (perineal discomfort).   coconut oil Oil Apply 1 application topically as needed.   HYDROcodone-acetaminophen 5-325 MG tablet Commonly known as: Norco Take 1 tablet by mouth every 4 (four) hours as needed for up to 3 days for moderate pain.   ibuprofen 600 MG tablet Commonly known as: ADVIL Take 1 tablet (600 mg total) by mouth every 6 (six) hours as needed for mild pain or moderate pain.   levothyroxine 125 MCG  tablet Commonly known as: SYNTHROID Take 1 tablet (125 mcg total) by mouth daily before breakfast.   prenatal multivitamin Tabs tablet Take 1 tablet by mouth daily at 12 noon.   senna-docusate 8.6-50 MG tablet Commonly known as: Senokot-S Take 2 tablets by mouth daily as needed for mild constipation.       Follow-up Information    Carolan Shiver, MD Follow up in 2 week(s).   Specialty: General Surgery Why: follow up cholecystectomy  Contact information: [address] [place] [address] [phone]

## 2020-03-24 NOTE — Progress Notes (Signed)
Hannah Maldonado  A and O x 4 VSS. Pt tolerating diet well. No complaints of pain or nausea. IV removed intact, prescriptions given. Pt voices understanding of discharge instructions with no further questions. Pt discharged via wheelchair with axillary.   Allergies as of 03/24/2020      Reactions   Torecan [thiethylperazine] Other (See Comments)   Muscle spasms      Medication List    TAKE these medications   acetaminophen 325 MG tablet Commonly known as: Tylenol Take 2 tablets (650 mg total) by mouth every 4 (four) hours as needed (for pain scale < 4).   benzocaine-Menthol 20-0.5 % Aero Commonly known as: DERMOPLAST Apply 1 application topically as needed for irritation (perineal discomfort).   coconut oil Oil Apply 1 application topically as needed.   HYDROcodone-acetaminophen 5-325 MG tablet Commonly known as: Norco Take 1 tablet by mouth every 4 (four) hours as needed for up to 3 days for moderate pain.   ibuprofen 600 MG tablet Commonly known as: ADVIL Take 1 tablet (600 mg total) by mouth every 6 (six) hours as needed for mild pain or moderate pain.   levothyroxine 125 MCG tablet Commonly known as: SYNTHROID Take 1 tablet (125 mcg total) by mouth daily before breakfast.   ondansetron 4 MG tablet Commonly known as: Zofran Take 1 tablet (4 mg total) by mouth every 8 (eight) hours as needed for nausea or vomiting.   prenatal multivitamin Tabs tablet Take 1 tablet by mouth daily at 12 noon.   senna-docusate 8.6-50 MG tablet Commonly known as: Senokot-S Take 2 tablets by mouth daily as needed for mild constipation.       Vitals:   03/24/20 0445 03/24/20 1437  BP: 115/74 104/67  Pulse: 60 74  Resp: 16 16  Temp: 97.9 F (36.6 C) 98.1 F (36.7 C)  SpO2: 98% 98%    Hannah Maldonado Hannah Maldonado

## 2020-03-25 LAB — SURGICAL PATHOLOGY

## 2020-03-31 ENCOUNTER — Encounter: Payer: Self-pay | Admitting: General Surgery

## 2020-06-04 IMAGING — US US OB < 14 WEEKS - US OB TV
1 series · 14 of 28 positions shown · non-contrast
Comparison: None.

CLINICAL DATA: Lower abdominal pain for 1 day.

EXAM:
OBSTETRIC <14 WK US AND TRANSVAGINAL OB US
TECHNIQUE: Both transabdominal and transvaginal ultrasound examinations were
performed for complete evaluation of the gestation as well as the
maternal uterus, adnexal regions, and pelvic cul-de-sac.
Transvaginal technique was performed to assess early pregnancy.

[Series 1: us ob < 14 weeks - us ob tv · 113 acquisitions, 14 frames shown]
[im 5/113]
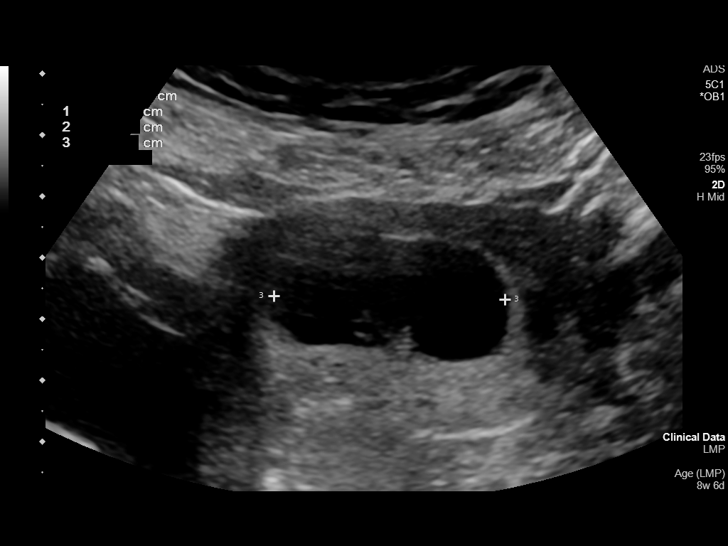
[im 13/113]
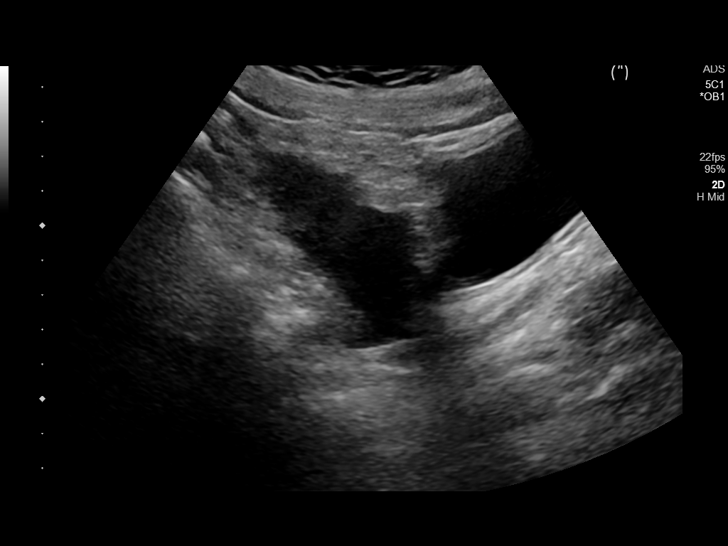
[im 21/113]
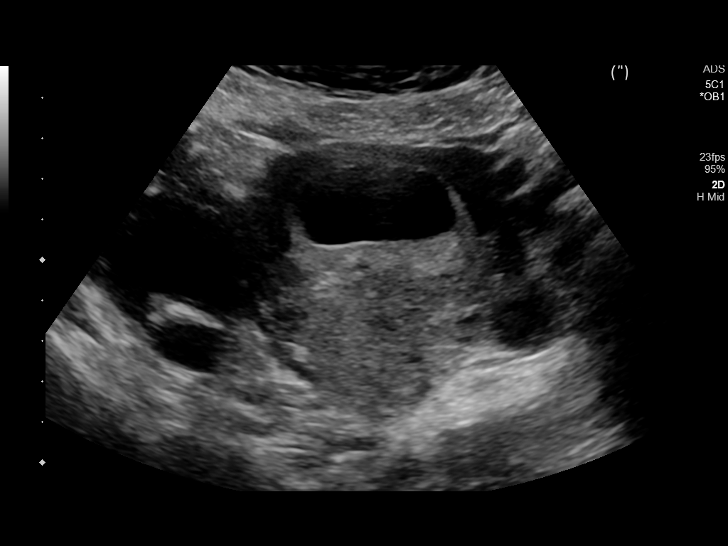
[im 30/113]
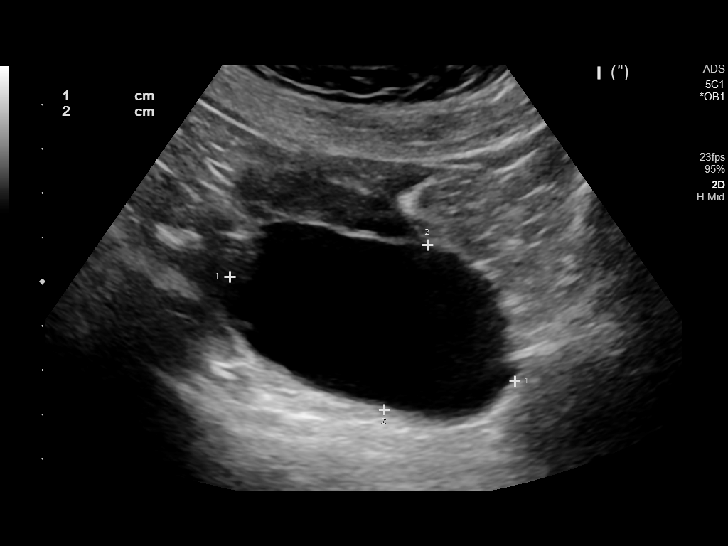
[im 38/113]
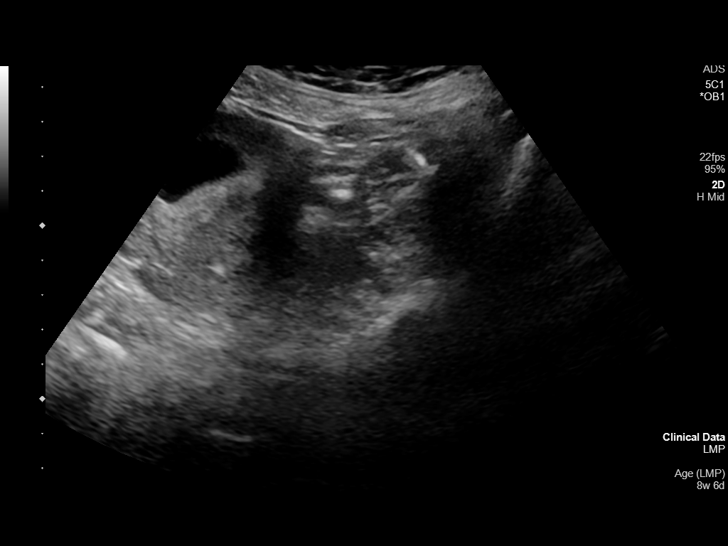
[im 46/113]
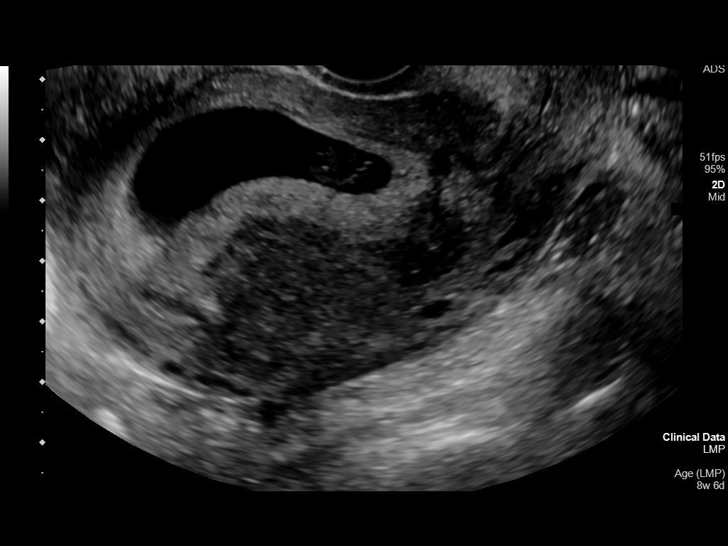
[im 54/113]
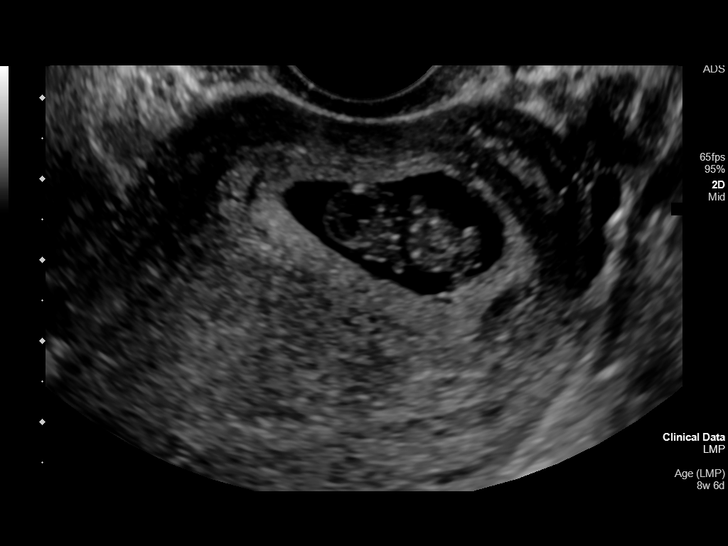
[im 63/113]
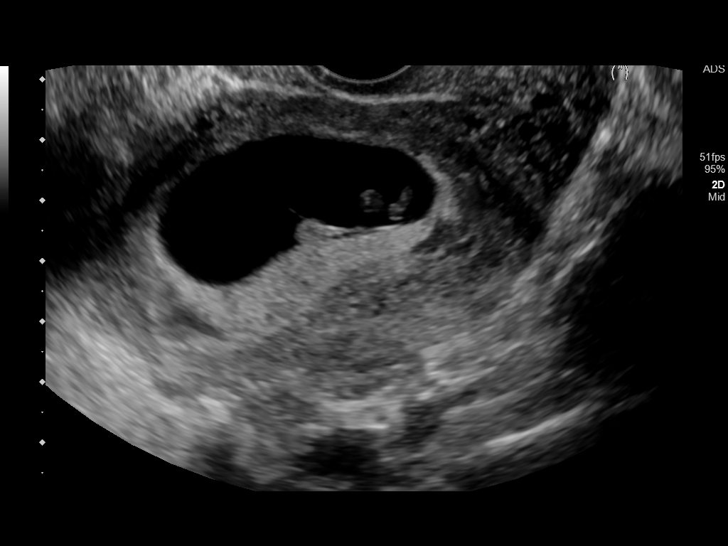
[im 71/113]
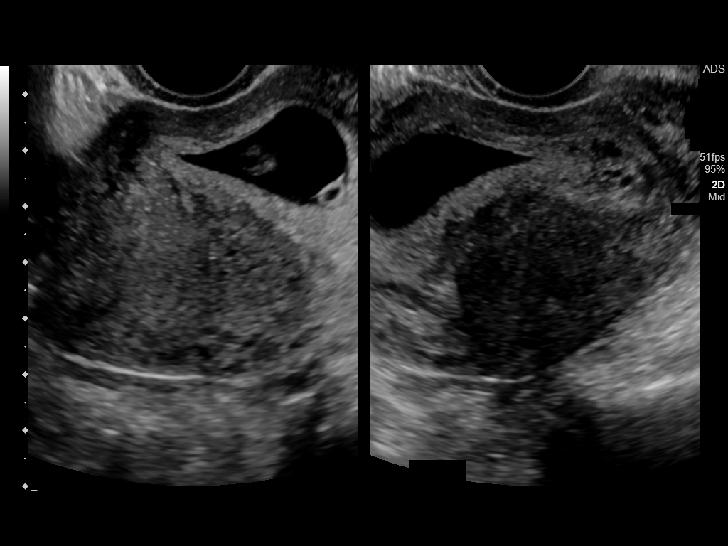
[im 79/113]
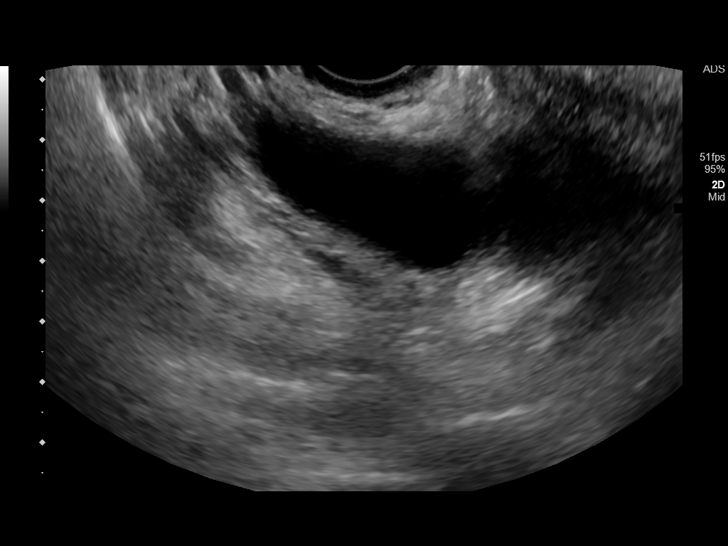
[im 88/113]
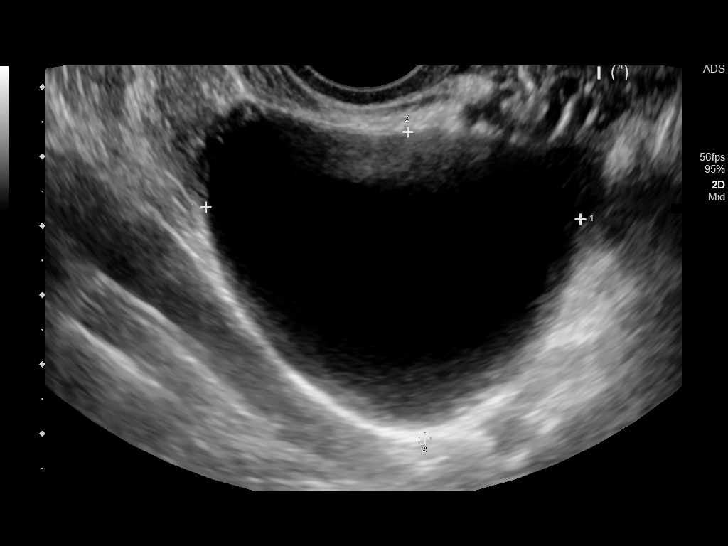
[im 96/113]
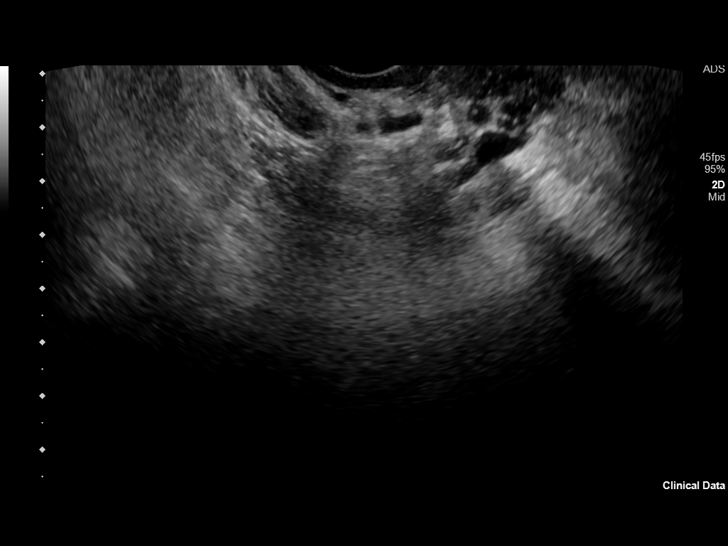
[im 104/113]
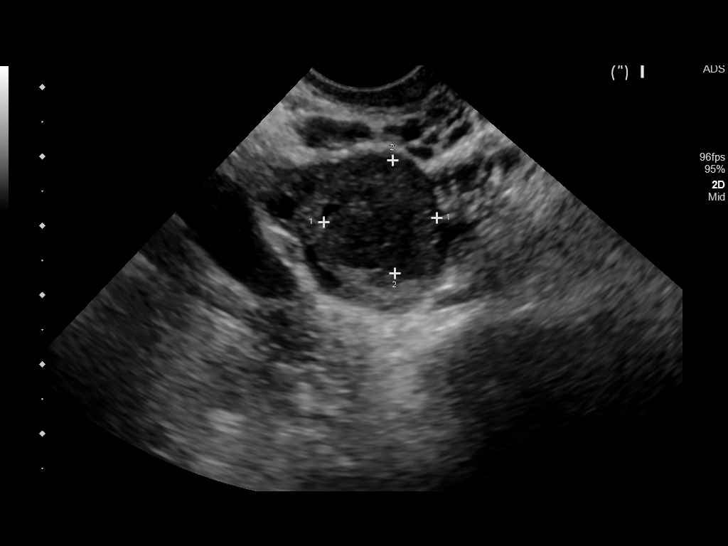
[im 113/113]
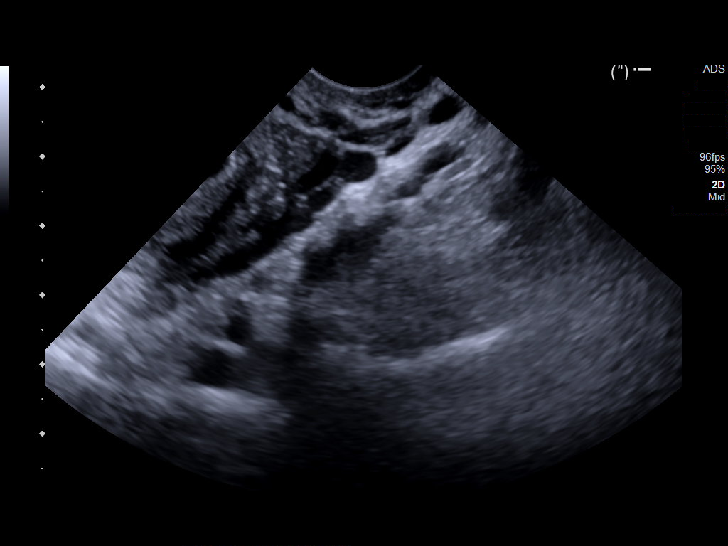

[14 of 28 positions shown; findings below may reference images not displayed]

FINDINGS: Intrauterine gestational sac: Single

Yolk sac:  Yes

Embryo:  Yes

Cardiac Activity: Yes

Heart Rate: 171 bpm

CRL:  21.2 mm   8 w   5 d                  US EDC: 02/18/2020

Subchorionic hemorrhage:  None visualized.

Maternal uterus/adnexae: 5.5 cm simple cyst in the right ovary.
cm corpus luteum cyst on the left ovary.
IMPRESSION: Normal-appearing single intrauterine pregnancy approximately 8 weeks
5 days gestation.

## 2020-10-01 IMAGING — US US OB COMP +14 WK
1 series · 13 of 28 positions shown · non-contrast
Comparison: none

CLINICAL DATA: Fetal anatomy evaluation

EXAM:
OBSTETRICAL ULTRASOUND >14 WKS

[Series 1: us ob comp + 14 wk · 13 of 83 slices shown]
[im 4/83]
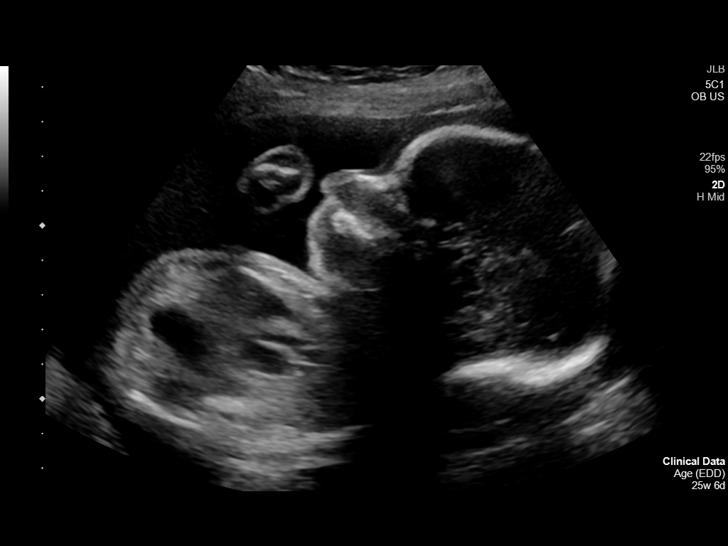
[im 10/83]
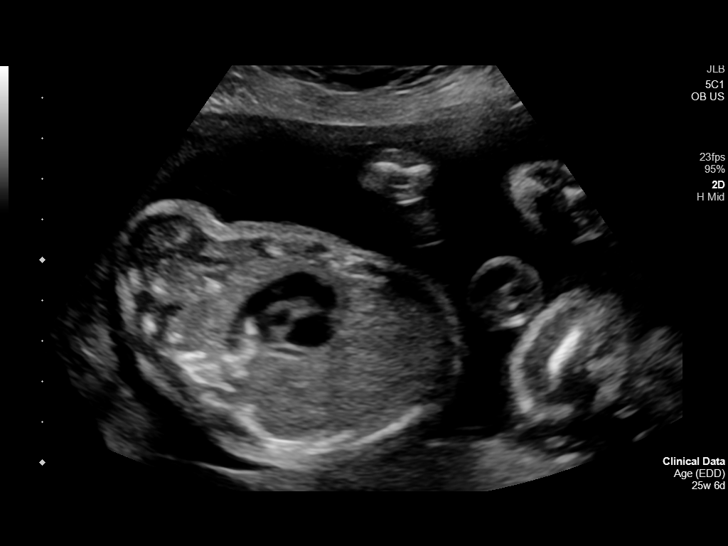
[im 16/83]
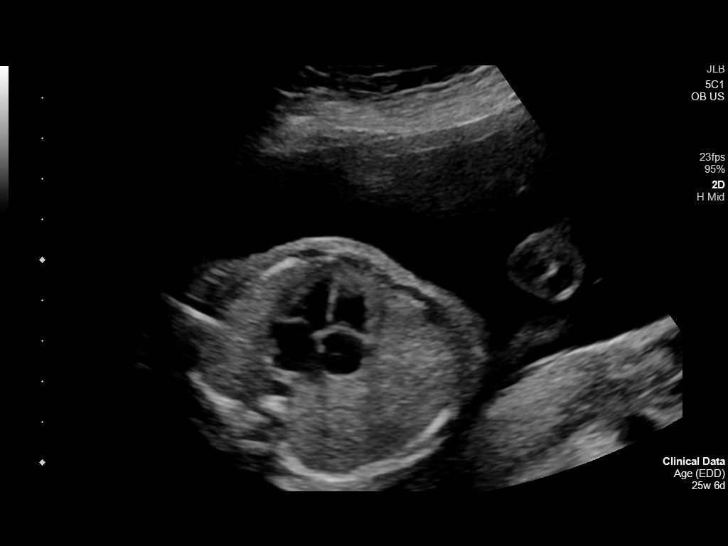
[im 22/83]
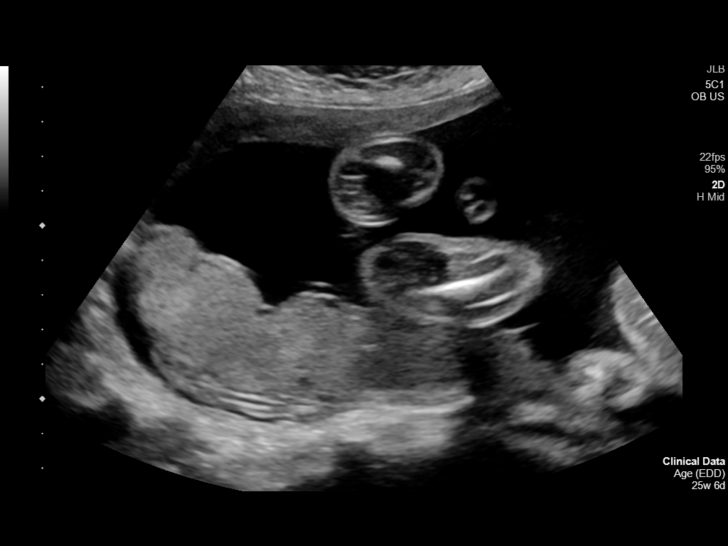
[im 28/83]
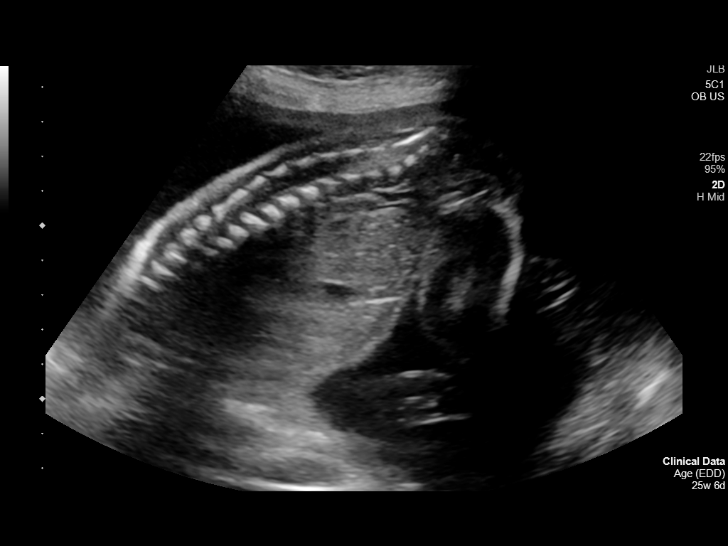
[im 34/83]
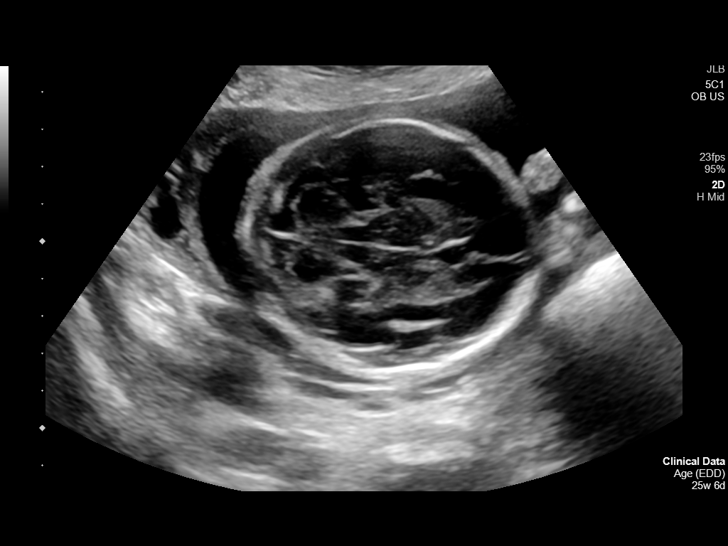
[im 43/83]
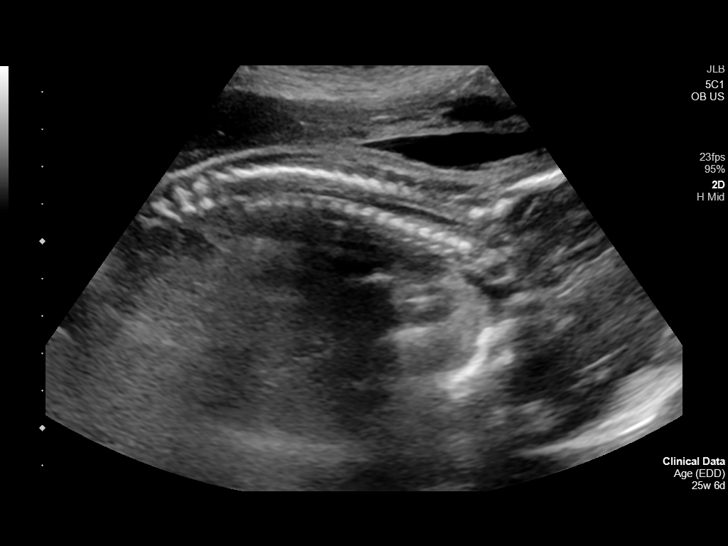
[im 49/83]
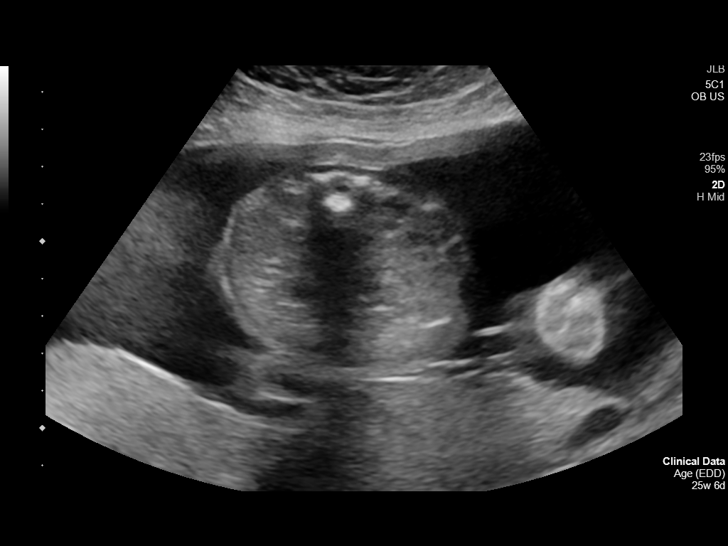
[im 55/83]
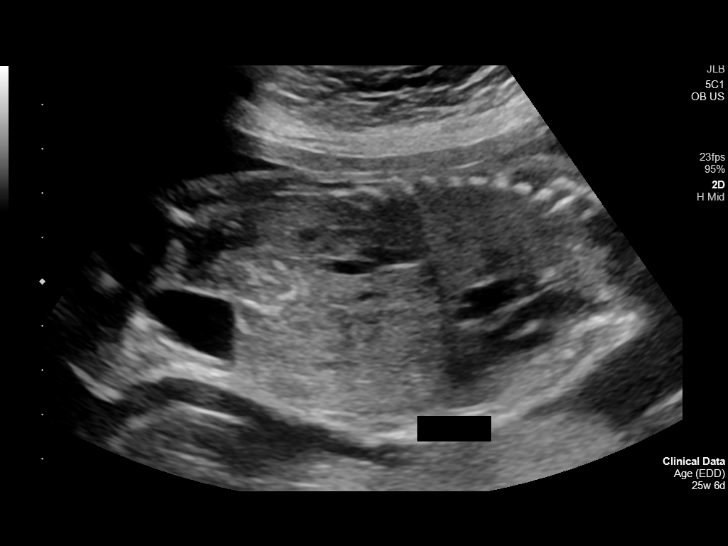
[im 61/83]
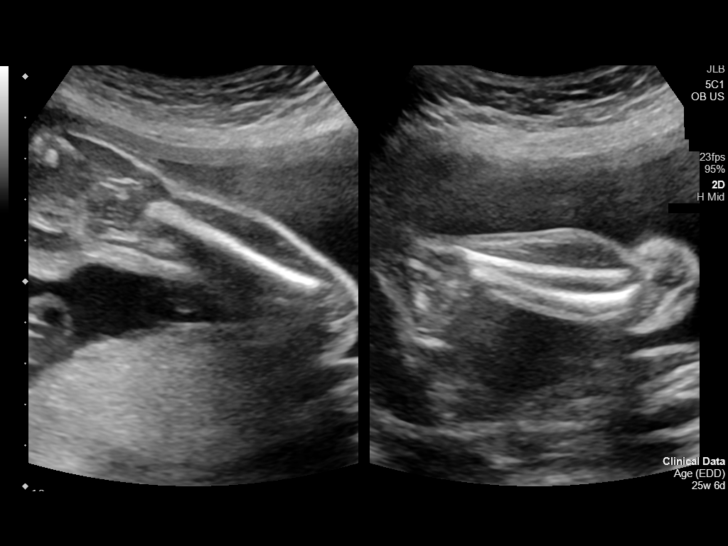
[im 67/83]
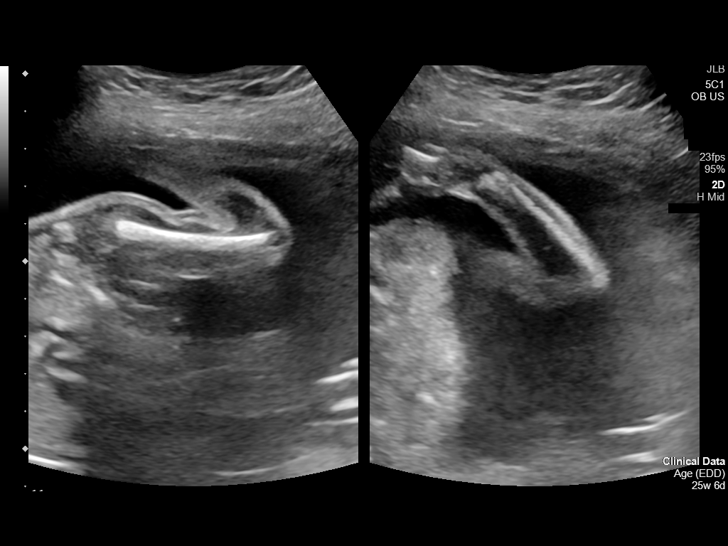
[im 73/83]
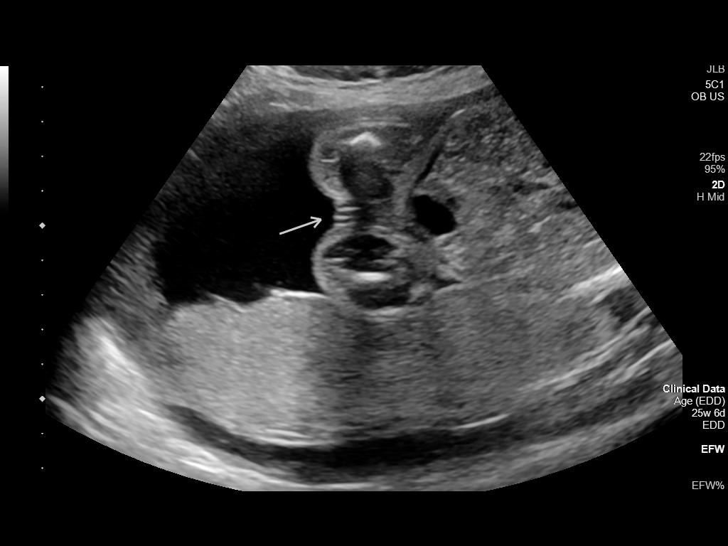
[im 79/83]
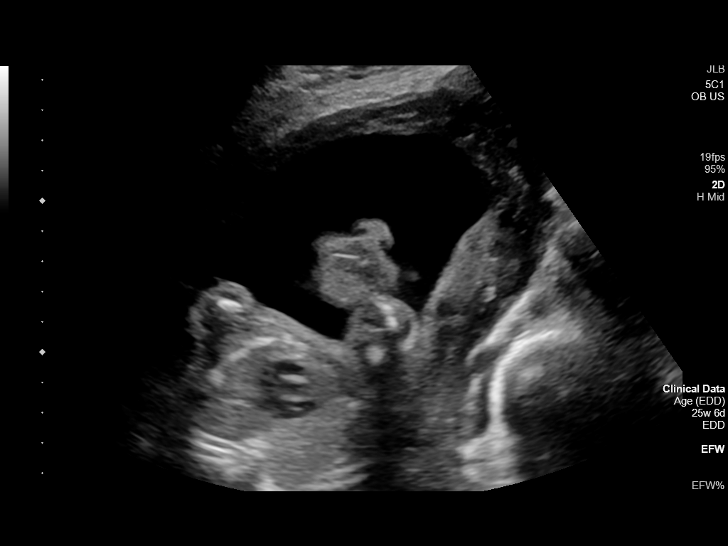

[13 of 28 positions shown; findings below may reference images not displayed]

FINDINGS: Number of Fetuses: 1

Heart Rate:  158 bpm

Movement: Yes

Presentation: Cephalic

Previa: No

Placental Location: Posterior fundal

Amniotic Fluid (Subjective): Normal

Amniotic Fluid (Objective):

Vertical pocket = 5.9cm

FETAL BIOMETRY

BPD: 6.7cm 27w 0d

HC: 24.22cm  6w   2d

AC: 21.425cm  w   6d

FL: 4.8cm  26w   1d

Current Mean GA: 26w 1d                     US EDC: 02/15/2020

Assigned GA:  25w 6d            Assigned EDC: 02/17/2020

Estimated Fetal Weight: 890.5g 48.8%ile

FETAL ANATOMY

Lateral Ventricles: Appears normal

Thalami/CSP: Appears normal

Posterior Fossa:  Appears normal

Nuchal Region: Appears normal   NFT= not applicable

Upper Lip: Appears normal

Spine: Appears normal

4 Chamber Heart on Left: Appears normal

LVOT: Appears normal

RVOT: Appears normal

Stomach on Left: Appears normal

3 Vessel Cord: Appears normal

Cord Insertion site: Appears normal

Kidneys: Appears normal

Bladder: Appears normal

Extremities: Appears normal

Technically difficult due to: None

Maternal Findings:

Cervix:  Normal cervical length measuring 3.8 cm.  Cervix is closed.
IMPRESSION: 1. Single live intrauterine pregnancy as detailed above.
2.  No focal fetal anatomic abnormality.

## 2021-02-07 IMAGING — US US PELVIS COMPLETE
1 series · 13 of 25 positions shown · non-contrast
Comparison: Ob ultrasound dated 11/10/2019.

CLINICAL DATA: The patient is 3 weeks postpartum and has fever.
Concern for endometritis, retained products of conception.

EXAM:
TRANSABDOMINAL ULTRASOUND OF PELVIS
DOPPLER ULTRASOUND OF OVARIES
TECHNIQUE: Transabdominal ultrasound examination of the pelvis was performed
including evaluation of the uterus, ovaries, adnexal regions, and
pelvic cul-de-sac.
Color and duplex Doppler ultrasound was utilized to evaluate blood
flow to the ovaries.

[Series 1: us pelvic complete with transvaginal · 13 of 93 slices shown]
[im 1/93]
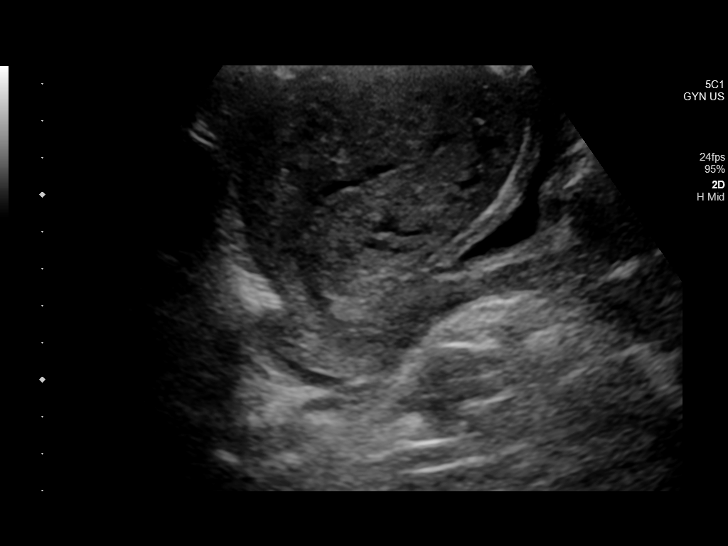
[im 8/93]
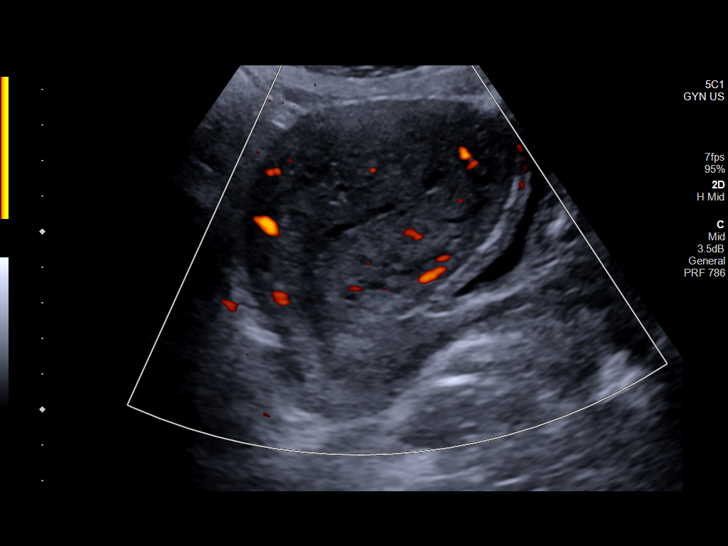
[im 16/93]
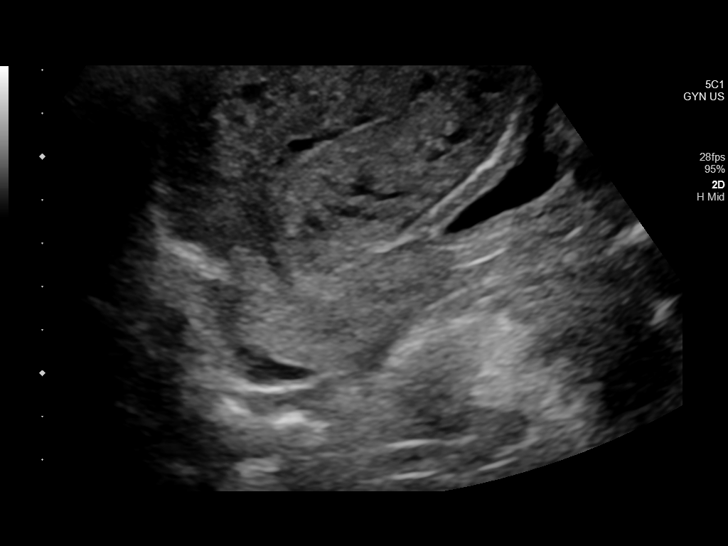
[im 24/93]
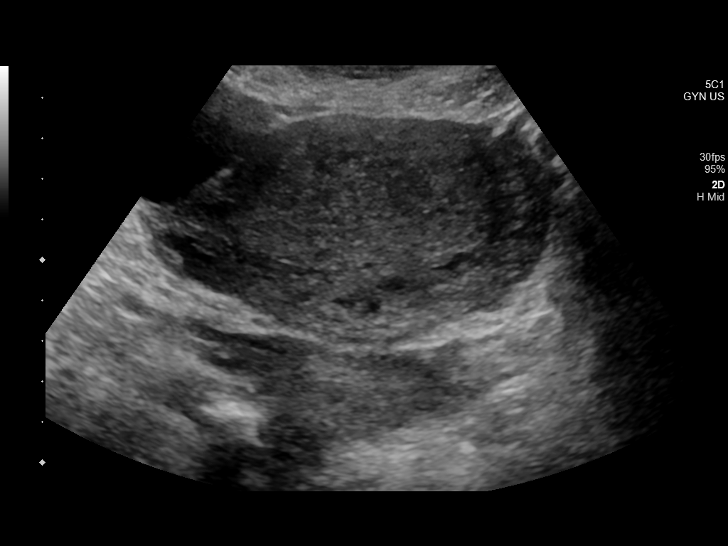
[im 31/93]
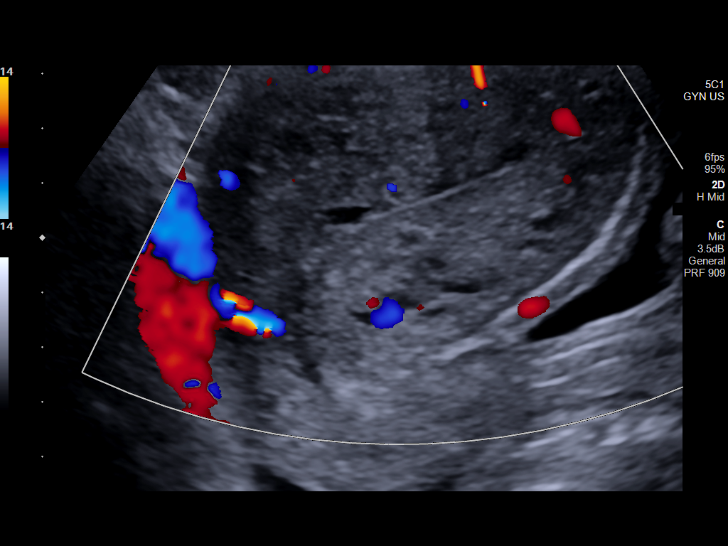
[im 39/93]
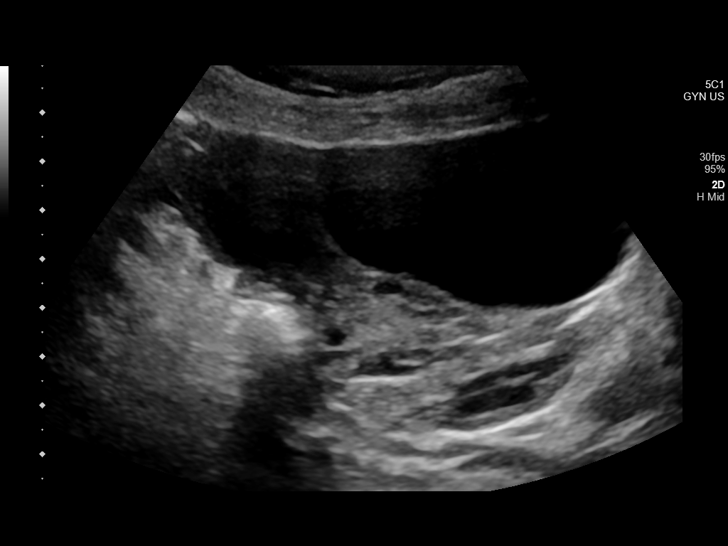
[im 47/93]
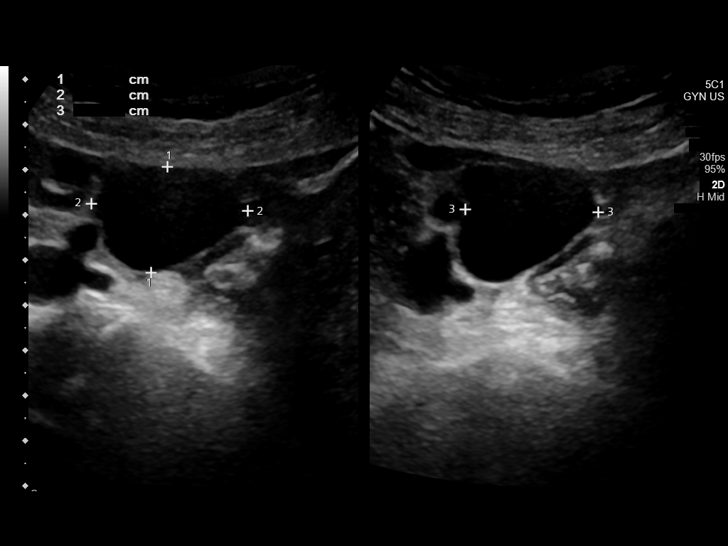
[im 54/93]
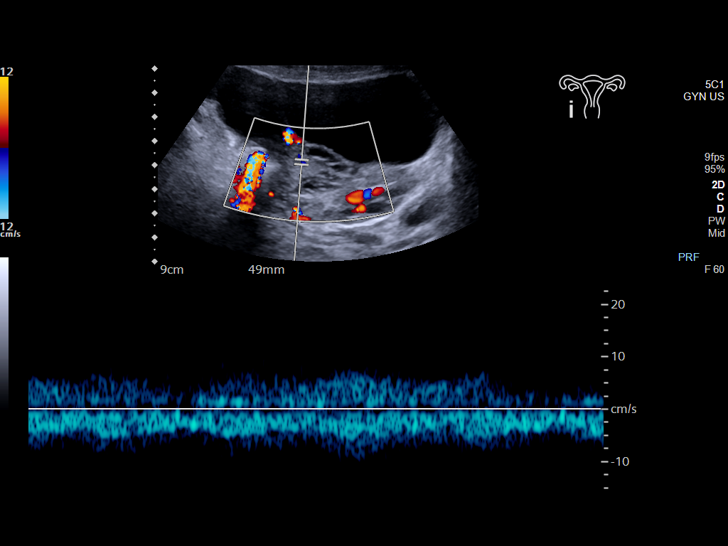
[im 62/93]
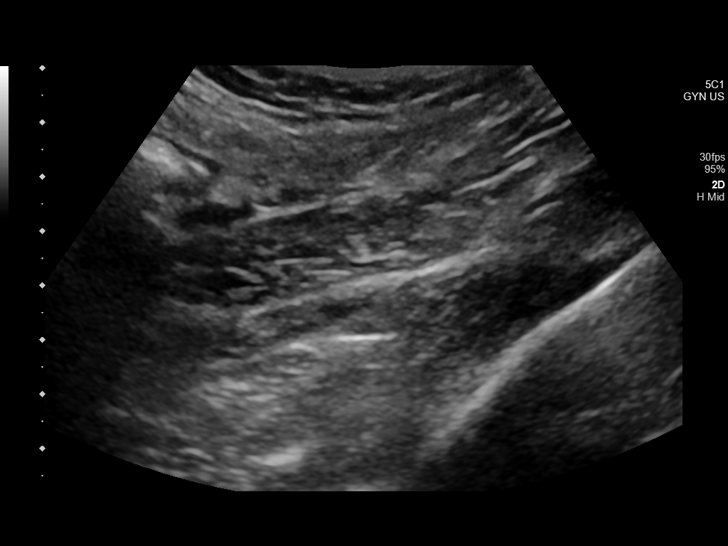
[im 70/93]
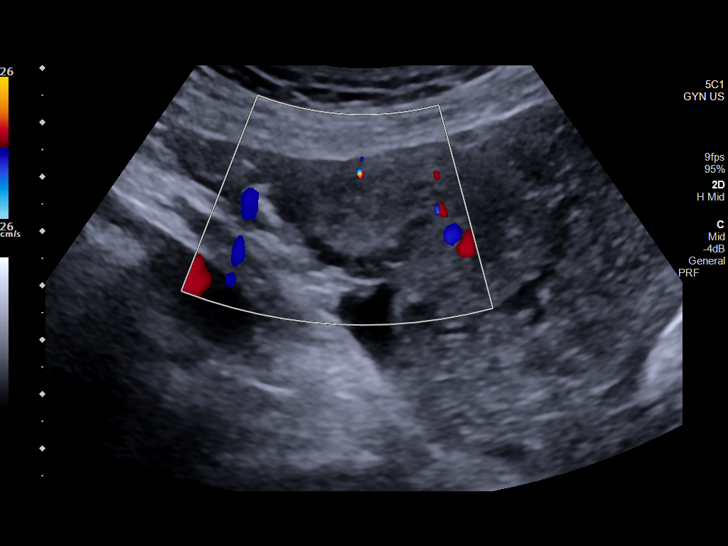
[im 77/93]
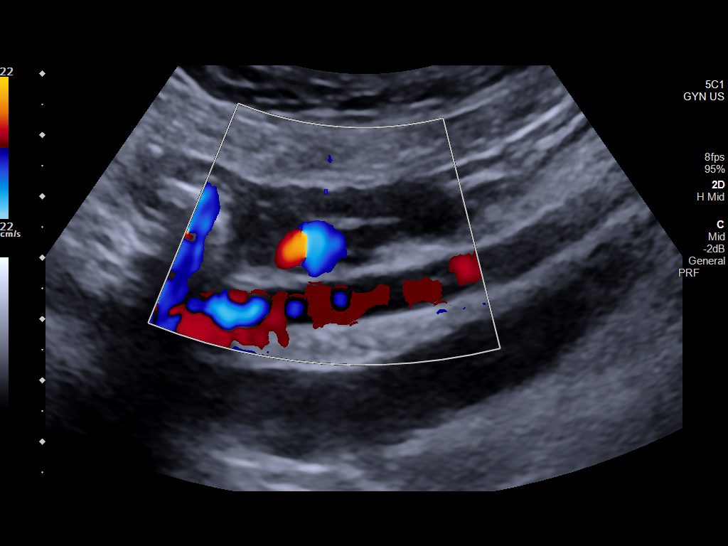
[im 85/93]
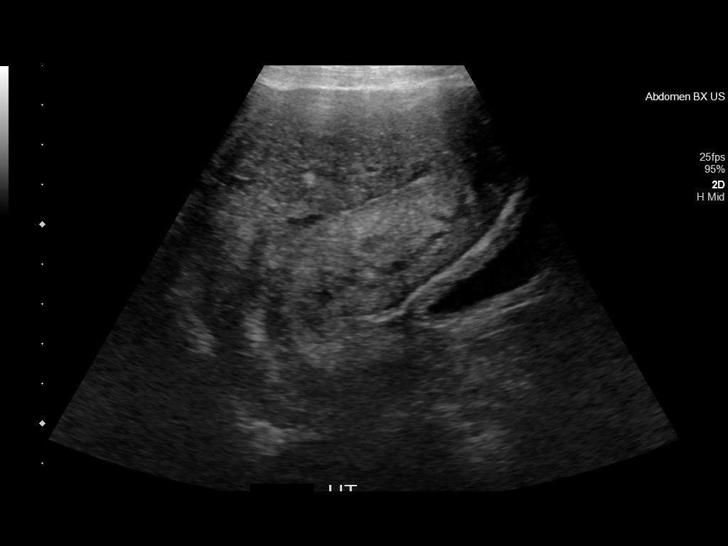
[im 93/93]
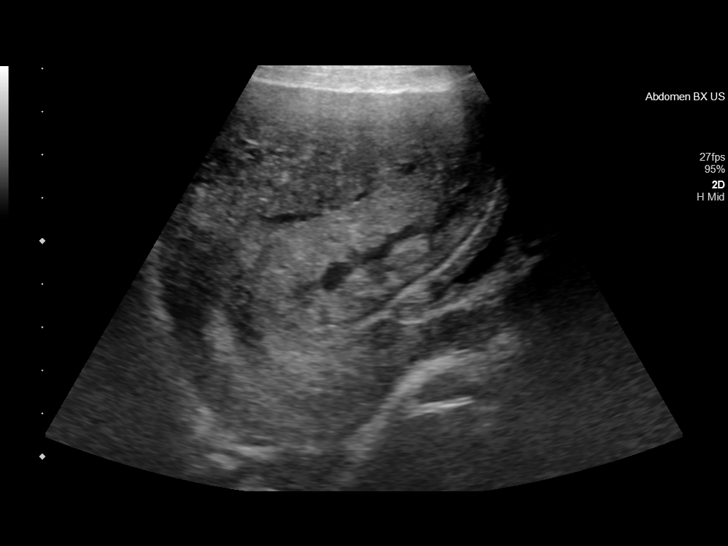

[13 of 25 positions shown; findings below may reference images not displayed]

FINDINGS: Uterus

Measurements: 9.4 x 6.2 x 8.3 cm = volume: 252 mL. No fibroids or
other mass visualized.

Endometrium

Thickness: 2.7 cm. No focal abnormality visualized. No significant
hyperemia or definite evidence of retained products of conception.
No definite intrauterine gas.

Right ovary

Measurements: 3.9 x 2.2 x 3.5 cm = volume: 16 mL. Two adnexal cysts
are noted, measuring 5.9 x 3.4 x 5.4 cm and 3.5 x 2.9 x 2.4 cm.

Left ovary

Measurements: 3.6 x 3.3 x 2.5 cm = volume: 15 mL. Normal
appearance/no adnexal mass.

Pulsed Doppler evaluation demonstrates normal low-resistance
arterial and venous waveforms in both ovaries.

Other: There is trace free fluid in the pelvis.
IMPRESSION: Thickened endometrium likely represents endometritis.

## 2021-02-07 IMAGING — CR DG CHEST 2V
1 series · 2 of 2 positions shown · non-contrast
Comparison: None.

CLINICAL DATA: Three weeks postpartum with sepsis.

EXAM:
CHEST - 2 VIEW

[Series 1: dg chest 2 view · 0.14mm/px · 2 of 2 slices shown]
[im 1/2]
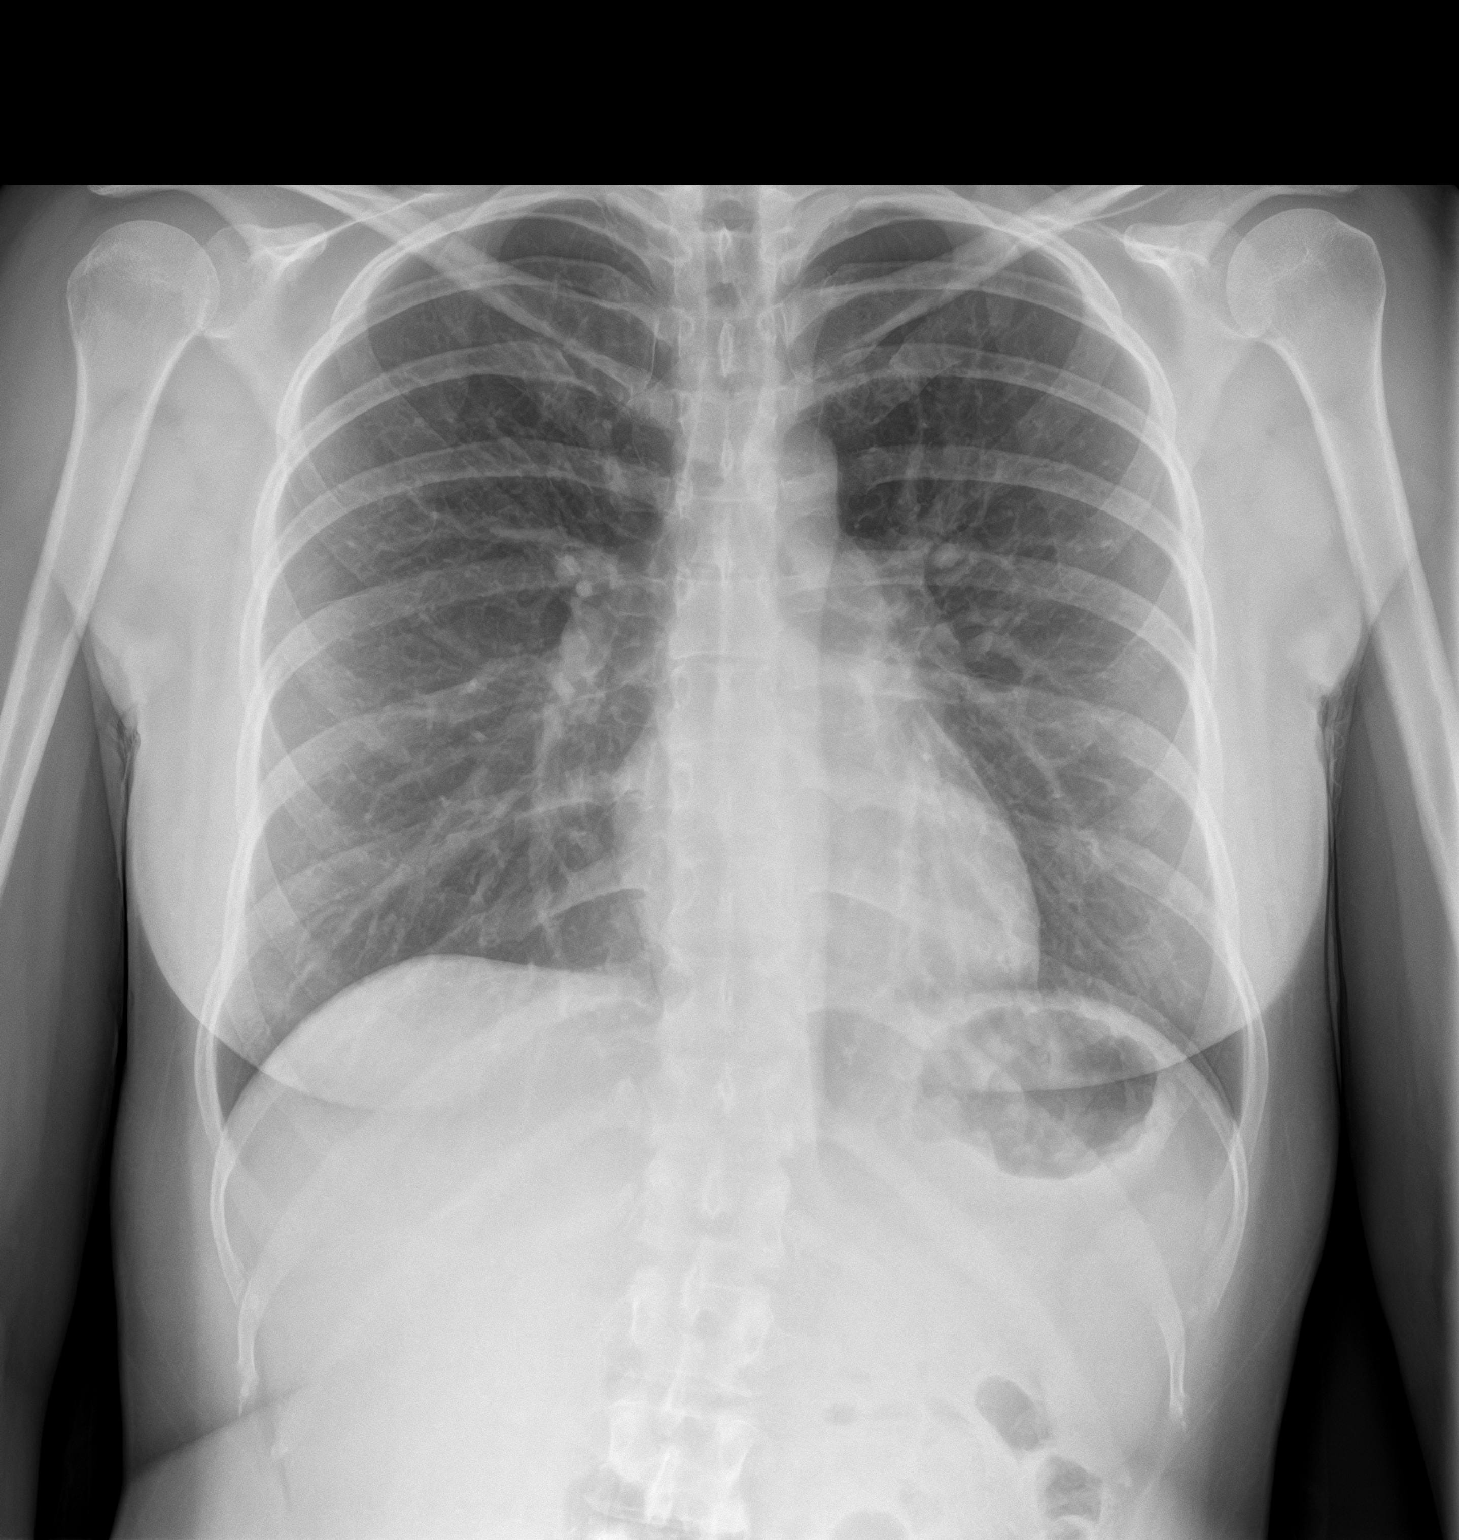
[im 2/2]
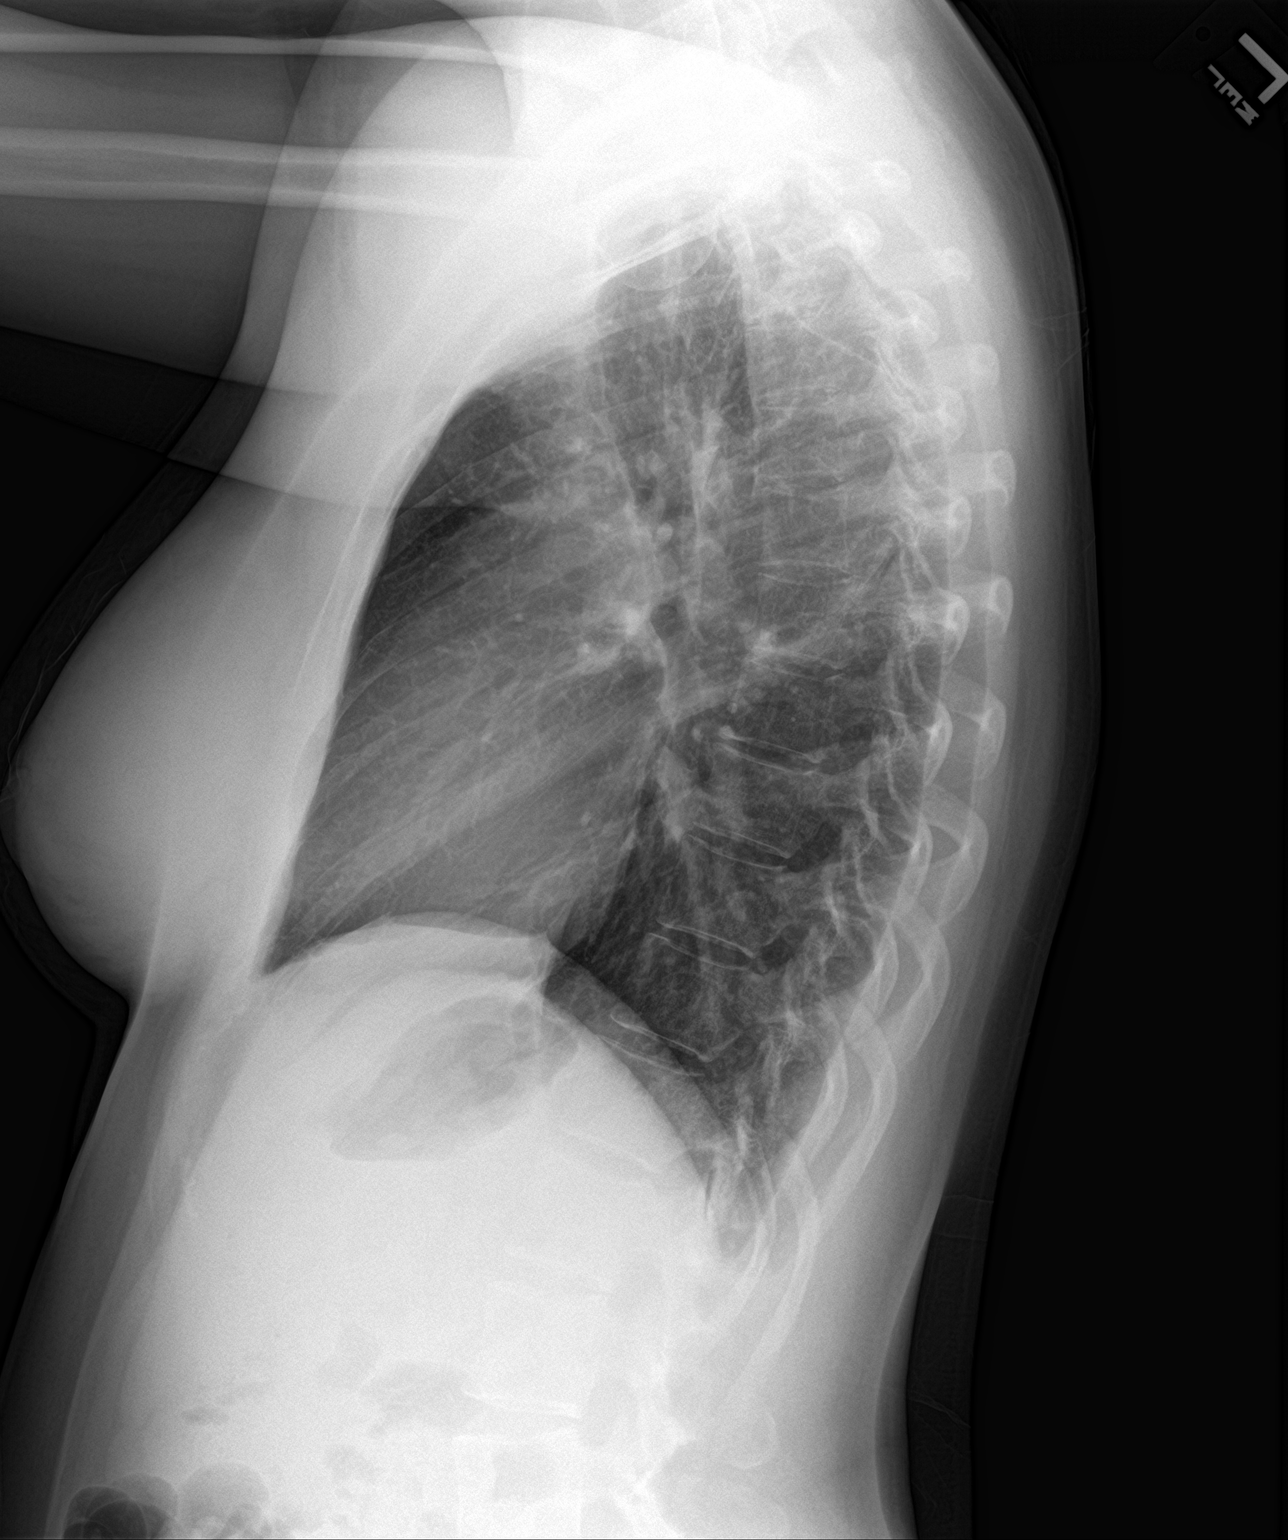

[2 of 2 positions shown; findings below may reference images not displayed]

FINDINGS: The heart size and mediastinal contours are within normal limits.
Both lungs are clear. The visualized skeletal structures are
unremarkable.
IMPRESSION: No active cardiopulmonary disease.

## 2021-02-21 ENCOUNTER — Emergency Department
Admission: EM | Admit: 2021-02-21 | Discharge: 2021-02-21 | Disposition: A | Discharge status: Home / Self Care | Payer: BC Managed Care – PPO | Attending: Emergency Medicine | Admitting: Emergency Medicine

## 2021-02-21 ENCOUNTER — Other Ambulatory Visit: Payer: Self-pay

## 2021-02-21 ENCOUNTER — Encounter: Payer: Self-pay | Admitting: Emergency Medicine

## 2021-02-21 DIAGNOSIS — Z79899 Other long term (current) drug therapy: Secondary | ICD-10-CM | POA: Diagnosis not present

## 2021-02-21 DIAGNOSIS — W503XXA Accidental bite by another person, initial encounter: Secondary | ICD-10-CM | POA: Insufficient documentation

## 2021-02-21 DIAGNOSIS — S21032A Puncture wound without foreign body of left breast, initial encounter: Secondary | ICD-10-CM | POA: Insufficient documentation

## 2021-02-21 DIAGNOSIS — E039 Hypothyroidism, unspecified: Secondary | ICD-10-CM | POA: Insufficient documentation

## 2021-02-21 MED ORDER — AMOXICILLIN-POT CLAVULANATE 875-125 MG PO TABS
1.0000 | ORAL_TABLET | Freq: Two times a day (BID) | ORAL | 0 refills | Status: AC
Start: 2021-02-21 — End: 2021-02-28

## 2021-02-21 NOTE — Discharge Instructions (Addendum)
Follow-up with your regular doctor if any continued problems.   Use warm moist compresses to the area frequently. Begin using antibiotic twice a day for the next 7 days for infection.
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
## 2022-02-23 ENCOUNTER — Other Ambulatory Visit: Payer: Self-pay | Admitting: Family Medicine

## 2022-02-23 DIAGNOSIS — Z3481 Encounter for supervision of other normal pregnancy, first trimester: Secondary | ICD-10-CM

## 2022-03-10 ENCOUNTER — Other Ambulatory Visit: Payer: Self-pay | Admitting: Family Medicine

## 2022-03-10 ENCOUNTER — Ambulatory Visit
Admission: RE | Admit: 2022-03-10 | Discharge: 2022-03-10 | Disposition: A | Discharge status: Home / Self Care | Payer: BC Managed Care – PPO | Source: Ambulatory Visit | Attending: Family Medicine | Admitting: Family Medicine

## 2022-03-10 DIAGNOSIS — Z3689 Encounter for other specified antenatal screening: Secondary | ICD-10-CM | POA: Diagnosis not present

## 2022-03-10 DIAGNOSIS — Z3A11 11 weeks gestation of pregnancy: Secondary | ICD-10-CM | POA: Diagnosis not present

## 2022-03-10 DIAGNOSIS — Z3481 Encounter for supervision of other normal pregnancy, first trimester: Secondary | ICD-10-CM | POA: Insufficient documentation

## 2022-03-22 LAB — OB RESULTS CONSOLE VARICELLA ZOSTER ANTIBODY, IGG: Varicella: IMMUNE

## 2022-03-22 LAB — OB RESULTS CONSOLE HEPATITIS B SURFACE ANTIGEN: Hepatitis B Surface Ag: NEGATIVE

## 2022-03-22 LAB — OB RESULTS CONSOLE RUBELLA ANTIBODY, IGM: Rubella: IMMUNE

## 2022-03-23 LAB — OB RESULTS CONSOLE GC/CHLAMYDIA
Chlamydia: NEGATIVE
Neisseria Gonorrhea: NEGATIVE

## 2022-05-22 ENCOUNTER — Other Ambulatory Visit: Payer: Self-pay | Admitting: Physician Assistant

## 2022-05-22 DIAGNOSIS — Z3689 Encounter for other specified antenatal screening: Secondary | ICD-10-CM

## 2022-05-30 ENCOUNTER — Ambulatory Visit: Payer: BC Managed Care – PPO | Attending: Physician Assistant

## 2022-05-30 NOTE — Progress Notes (Deleted)
Pt was a "No Show" for her MFM appt today. 

## 2022-07-04 LAB — OB RESULTS CONSOLE RPR: RPR: NONREACTIVE

## 2022-08-08 ENCOUNTER — Other Ambulatory Visit: Payer: Self-pay

## 2022-08-08 ENCOUNTER — Ambulatory Visit: Payer: BC Managed Care – PPO | Attending: Maternal & Fetal Medicine

## 2022-08-08 ENCOUNTER — Other Ambulatory Visit: Payer: Self-pay | Admitting: Physician Assistant

## 2022-08-08 VITALS — BP 102/61 | HR 73 | Temp 97.7°F | Ht 60.0 in | Wt 157.5 lb

## 2022-08-08 DIAGNOSIS — O99283 Endocrine, nutritional and metabolic diseases complicating pregnancy, third trimester: Secondary | ICD-10-CM | POA: Insufficient documentation

## 2022-08-08 DIAGNOSIS — O24410 Gestational diabetes mellitus in pregnancy, diet controlled: Secondary | ICD-10-CM

## 2022-08-08 DIAGNOSIS — E039 Hypothyroidism, unspecified: Secondary | ICD-10-CM | POA: Diagnosis not present

## 2022-08-08 DIAGNOSIS — Z3689 Encounter for other specified antenatal screening: Secondary | ICD-10-CM

## 2022-08-08 DIAGNOSIS — Z3A32 32 weeks gestation of pregnancy: Secondary | ICD-10-CM | POA: Diagnosis present

## 2022-08-08 DIAGNOSIS — O26893 Other specified pregnancy related conditions, third trimester: Secondary | ICD-10-CM | POA: Diagnosis not present

## 2022-08-21 NOTE — L&D Delivery Note (Signed)
Delivery Note  First Stage: Labor onset: 11am Augmentation : pitocin  Analgesia /Anesthesia intrapartum: none SROM at 11am  Second Stage: Complete dilation at 0135 Onset of pushing at 0138 FHR second stage 125bpm, moderate variability  Delivery of a viable female infant 09/01/2022 at 0146 by Lucrezia Europe, CNM. delivery of fetal head in ROT position with restitution to OP. No nuchal cord;  Anterior then posterior shoulders delivered easily with gentle downward traction. Baby placed on mom's chest, and attended to by peds.  Cord double clamped after cessation of pulsation, cut by CNM Cord blood sample collected   Third Stage: Placenta delivered Duncan intact with 3 VC @ 0153 Placenta disposition: sent to pathology Placenta appears intact but torn. Uterine tone firm / bleeding scant  1st degree laceration identified  Anesthesia for repair: n/a Repair : none needed Est. Blood Loss (mL): 017BL  Complications: none  Mom to postpartum.  Baby to Couplet care / Skin to Skin.  Newborn: Birth Weight: TBD, infant skin-to-skin  Apgar Scores: 8, 9 Feeding planned: breastfeeding and bottlefeeding

## 2022-08-31 ENCOUNTER — Encounter: Payer: Self-pay | Admitting: Obstetrics and Gynecology

## 2022-08-31 ENCOUNTER — Inpatient Hospital Stay
Admission: EM | Admit: 2022-08-31 | Discharge: 2022-09-02 | DRG: 797 | Disposition: A | Discharge status: Home / Self Care | Payer: BC Managed Care – PPO

## 2022-08-31 ENCOUNTER — Other Ambulatory Visit: Payer: Self-pay

## 2022-08-31 DIAGNOSIS — D62 Acute posthemorrhagic anemia: Secondary | ICD-10-CM | POA: Diagnosis not present

## 2022-08-31 DIAGNOSIS — O9902 Anemia complicating childbirth: Secondary | ICD-10-CM | POA: Diagnosis present

## 2022-08-31 DIAGNOSIS — O99284 Endocrine, nutritional and metabolic diseases complicating childbirth: Secondary | ICD-10-CM | POA: Diagnosis present

## 2022-08-31 DIAGNOSIS — O24419 Gestational diabetes mellitus in pregnancy, unspecified control: Secondary | ICD-10-CM

## 2022-08-31 DIAGNOSIS — O42919 Preterm premature rupture of membranes, unspecified as to length of time between rupture and onset of labor, unspecified trimester: Secondary | ICD-10-CM

## 2022-08-31 DIAGNOSIS — Z302 Encounter for sterilization: Secondary | ICD-10-CM | POA: Diagnosis not present

## 2022-08-31 DIAGNOSIS — E039 Hypothyroidism, unspecified: Secondary | ICD-10-CM | POA: Diagnosis present

## 2022-08-31 DIAGNOSIS — O26893 Other specified pregnancy related conditions, third trimester: Secondary | ICD-10-CM | POA: Diagnosis not present

## 2022-08-31 DIAGNOSIS — O42913 Preterm premature rupture of membranes, unspecified as to length of time between rupture and onset of labor, third trimester: Principal | ICD-10-CM | POA: Diagnosis present

## 2022-08-31 DIAGNOSIS — O99280 Endocrine, nutritional and metabolic diseases complicating pregnancy, unspecified trimester: Secondary | ICD-10-CM | POA: Insufficient documentation

## 2022-08-31 DIAGNOSIS — O4290 Premature rupture of membranes, unspecified as to length of time between rupture and onset of labor, unspecified weeks of gestation: Principal | ICD-10-CM | POA: Diagnosis present

## 2022-08-31 DIAGNOSIS — Z3A36 36 weeks gestation of pregnancy: Secondary | ICD-10-CM | POA: Diagnosis not present

## 2022-08-31 DIAGNOSIS — O24425 Gestational diabetes mellitus in childbirth, controlled by oral hypoglycemic drugs: Secondary | ICD-10-CM | POA: Diagnosis present

## 2022-08-31 LAB — CBC
HCT: 35.5 % — ABNORMAL LOW (ref 36.0–46.0)
Hemoglobin: 12.3 g/dL (ref 12.0–15.0)
MCH: 30.4 pg (ref 26.0–34.0)
MCHC: 34.6 g/dL (ref 30.0–36.0)
MCV: 87.7 fL (ref 80.0–100.0)
Platelets: 221 10*3/uL (ref 150–400)
RBC: 4.05 MIL/uL (ref 3.87–5.11)
RDW: 13.8 % (ref 11.5–15.5)
WBC: 8.3 10*3/uL (ref 4.0–10.5)
nRBC: 0 % (ref 0.0–0.2)

## 2022-08-31 LAB — TYPE AND SCREEN
ABO/RH(D): O POS
Antibody Screen: NEGATIVE

## 2022-08-31 LAB — RUPTURE OF MEMBRANE (ROM)PLUS: Rom Plus: POSITIVE

## 2022-08-31 LAB — GROUP B STREP BY PCR: Group B strep by PCR: NEGATIVE

## 2022-08-31 LAB — GLUCOSE, CAPILLARY
Glucose-Capillary: 78 mg/dL (ref 70–99)
Glucose-Capillary: 82 mg/dL (ref 70–99)

## 2022-08-31 MED ORDER — LACTATED RINGERS IV SOLN: INTRAVENOUS | Status: DC

## 2022-08-31 MED ORDER — OXYTOCIN-SODIUM CHLORIDE 30-0.9 UT/500ML-% IV SOLN
1.0000 m[IU]/min | INTRAVENOUS | Status: DC
  Administered 2022-08-31: 2 m[IU]/min via INTRAVENOUS

## 2022-08-31 MED ORDER — LACTATED RINGERS IV SOLN
500.0000 mL | INTRAVENOUS | Status: DC | PRN
  Administered 2022-09-01: 500 mL via INTRAVENOUS

## 2022-08-31 MED ORDER — FENTANYL CITRATE (PF) 100 MCG/2ML IJ SOLN
50.0000 ug | INTRAMUSCULAR | Status: DC | PRN
  Administered 2022-09-01: 50 ug via INTRAVENOUS
  Filled 2022-08-31: qty 2

## 2022-08-31 MED ORDER — SOD CITRATE-CITRIC ACID 500-334 MG/5ML PO SOLN: 30.0000 mL | ORAL | Status: DC | PRN

## 2022-08-31 MED ORDER — SODIUM CHLORIDE 0.9 % IV SOLN: 5.0000 10*6.[IU] | Freq: Once | INTRAVENOUS | Status: DC

## 2022-08-31 MED ORDER — OXYTOCIN BOLUS FROM INFUSION
333.0000 mL | Freq: Once | INTRAVENOUS | Status: AC
  Administered 2022-09-01: 333 mL via INTRAVENOUS

## 2022-08-31 MED ORDER — OXYTOCIN-SODIUM CHLORIDE 30-0.9 UT/500ML-% IV SOLN
2.5000 [IU]/h | INTRAVENOUS | Status: DC
  Administered 2022-09-01: 2.5 [IU]/h via INTRAVENOUS
  Filled 2022-08-31: qty 500

## 2022-08-31 MED ORDER — AMMONIA AROMATIC IN INHA
RESPIRATORY_TRACT | Status: AC
  Filled 2022-08-31: qty 10

## 2022-08-31 MED ORDER — TERBUTALINE SULFATE 1 MG/ML IJ SOLN: 0.2500 mg | Freq: Once | INTRAMUSCULAR | Status: DC | PRN

## 2022-08-31 MED ORDER — LIDOCAINE HCL (PF) 1 % IJ SOLN
30.0000 mL | INTRAMUSCULAR | Status: DC | PRN
  Filled 2022-08-31: qty 30

## 2022-08-31 MED ORDER — ACETAMINOPHEN 325 MG PO TABS: 650.0000 mg | ORAL_TABLET | ORAL | Status: DC | PRN

## 2022-08-31 MED ORDER — MISOPROSTOL 200 MCG PO TABS
ORAL_TABLET | ORAL | Status: AC
  Filled 2022-08-31: qty 4

## 2022-08-31 MED ORDER — ONDANSETRON HCL 4 MG/2ML IJ SOLN: 4.0000 mg | Freq: Four times a day (QID) | INTRAMUSCULAR | Status: DC | PRN

## 2022-08-31 MED ORDER — PENICILLIN G POT IN DEXTROSE 60000 UNIT/ML IV SOLN: 3.0000 10*6.[IU] | INTRAVENOUS | Status: DC

## 2022-08-31 MED ORDER — OXYTOCIN 10 UNIT/ML IJ SOLN
INTRAMUSCULAR | Status: AC
  Filled 2022-08-31: qty 2

## 2022-08-31 NOTE — H&P (Signed)
OB History & Physical   History of Present Illness:  Chief Complaint:   HPI:  Hannah Maldonado is a 37 y.o. G38P1001 female at [redacted]w[redacted]d dated by LMP.  She presents to L&D for leaking fluid since 11am.  Active FM onset of ctx @ 1300 currently every 3-4 minutes LOF  / SROM @ 11am bloody show present   Pregnancy Issues: 1. GDM A2 (taking Metformin 500mg ) 2. Hyperthyroid, s/p thyroidectomy, now hypothyroid, taking Synthroid 3. Endometritis after delivery in 2021  Maternal Medical History:   Past Medical History:  Diagnosis Date   Anxiety    Depression    Hypothyroidism    Thyroid disease     Past Surgical History:  Procedure Laterality Date   CHOLECYSTECTOMY  03/19/2020   THYROIDECTOMY      Allergies  Allergen Reactions   Torecan [Thiethylperazine] Other (See Comments)    Muscle spasms-Paralyzed  neck muscles    Prior to Admission medications   Medication Sig Start Date End Date Taking? Authorizing Provider  levothyroxine (SYNTHROID) 125 MCG tablet Take 1 tablet (125 mcg total) by mouth daily before breakfast. Patient taking differently: Take 175 mcg by mouth daily before breakfast. 1 Hour before breakfast as of 08/08/22 03/21/20  Yes McVey, 05/21/20, CNM  metFORMIN (GLUCOPHAGE) 500 MG tablet Take 500 mg by mouth 1 day or 1 dose.   Yes [provider]  Prenatal Vit-Fe Fumarate-FA (PRENATAL MULTIVITAMIN) TABS tablet Take 1 tablet by mouth daily at 12 noon.   Yes [provider]   [facility]  Social History: She  reports that she has never smoked. She has never used smokeless tobacco. She reports that she does not currently use alcohol. She reports that she does not use drugs.  Family History: family history is not on file.   Review of Systems: A full review of systems was performed and negative except as noted in the HPI.     Physical Exam:  Vital Signs: BP 99/60 (BP Location: Left Arm)   Pulse 89   Temp 98.5 F  (36.9 C) (Oral)   Resp 17   Wt 68.9 kg   LMP 12/21/2021   BMI 29.69 kg/m  General: no acute distress.  HEENT: normocephalic, atraumatic Heart: regular rate & rhythm.  No murmurs/rubs/gallops Lungs: clear to auscultation bilaterally, normal respiratory effort Abdomen: soft, gravid, non-tender;  EFW: 6.5lb Pelvic:   External: Normal external female genitalia  Cervix: Dilation: 1 / Effacement (%): Thick / Station: -3    Extremities: non-tender, symmetric, mild edema bilaterally.  DTRs: +2  Neurologic: Alert & oriented x 3.    Results for orders placed or performed during the hospital encounter of 08/31/22 (from the past 24 hour(s))  ROM Plus ([facility] only)     Status: None   Collection Time: 08/31/22  1:16 PM  Result Value Ref Range   Rom Plus POSITIVE     Pertinent Results:  Prenatal Labs: Blood type/Rh O pos  Antibody screen neg  Rubella Immune  Varicella Immune  RPR NR  HBsAg Neg  HIV NR  GC neg  Chlamydia neg  Genetic screening negative  1 hour GTT 150  3 hour GTT 73, 180, 158, 152  GBS Pending by PCR   FHT: 135bpm, moderate variability, accelerations present, no decelerations TOCO: contractions q3-73min, palpate mild SVE:  Dilation: 1 / Effacement (%): Thick / Station: -3    Cephalic by leopolds, confirmed with bedside 11m  [place] MFM OB DETAIL +14 WK  Result Date: 08/08/2022 ----------------------------------------------------------------------  OBSTETRICS REPORT                       (Signed Final 08/08/2022 04:22 pm) ---------------------------------------------------------------------- Patient Info  ID #:       [id number]                          D.O.B.:  1986/07/23 (36 yrs)  Name:       Hannah Bumpers-                 Visit Date: 08/08/2022 03:10 pm              Maldonado ---------------------------------------------------------------------- Performed By  Attending:        Sander Nephew      Referred By:      [facility]  Performed By:     Rodrigo Ran BS      [facility]                                 [facility] at                                                             [facility] ---------------------------------------------------------------------- Orders  #  Description                           Code        Ordered By  1  [place] MFM OB DETAIL +14 [place]               76811.01    Kerri Perches ----------------------------------------------------------------------  #  Order #                     Accession #                Episode #  1  [id number]                   [id number]                 [id number] ---------------------------------------------------------------------- Indications  [redacted] weeks gestation of pregnancy                Z3A.43  Antenatal screening for malformations          Z36.3  Gestational diabetes in pregnancy, diet        O24.410  controlled  Hypothyroid (Synthroid)                        O99.280 E03.9 ---------------------------------------------------------------------- Fetal Evaluation  Num  Of Fetuses:         1  Fetal Heart Rate(bpm):  150  Cardiac Activity:       Observed  Presentation:           Cephalic  Placenta:               Posterior  P. Cord Insertion:      Marginal insertion  Amniotic Fluid  AFI FV:      Within normal limits  AFI Sum(cm)     %Tile       Largest Pocket(cm)  18.15           67          5.92  RUQ(cm)       RLQ(cm)       LUQ(cm)        LLQ(cm)  5.25          2.69          4.29           5.92 ---------------------------------------------------------------------- Biometry  BPD:     83.12  mm     G. Age:  33w 3d         61  %    CI:        77.44   %    70 - 86                                                          FL/HC:      19.9   %    19.9 - 21.5  HC:    299.01   mm     G. Age:  33w 1d         20  %    HC/AC:      1.07        0.96 - 1.11  AC:    279.44   mm     G. Age:  32w 0d         26  %    FL/BPD:      71.6   %    71 - 87  FL:      59.54  mm     G. Age:  31w 0d        4.9  %    FL/AC:      21.3   %    20 - 24  HUM:      50.9  mm     G. Age:  29w 6d        < 5  %  CER:        43  mm     G. Age:  33w 6d         54  %  LV:        4.5  mm  CM:          9  mm  Est. FW:    1867  gm      4 lb 2 oz     16  % ---------------------------------------------------------------------- OB History  Gravidity:    2         Term:   1        Prem:   0  SAB:   0  TOP:          0       Ectopic:  0        Living: 1 ---------------------------------------------------------------------- Gestational Age  LMP:           32w 6d        Date:  12/21/21                   EDD:   09/27/22  U/S Today:     32w 3d                                        EDD:   09/30/22  Best:          32w 6d     Det. By:  LMP  (12/21/21)          EDD:   09/27/22 ---------------------------------------------------------------------- Anatomy  Cranium:               Appears normal         Aortic Arch:            Appears normal  Cavum:                 Appears normal         Ductal Arch:            Appears normal  Ventricles:            Appears normal         Diaphragm:              Appears normal  Choroid Plexus:        Appears normal         Stomach:                Appears normal, left                                                                        sided  Cerebellum:            Appears normal         Abdomen:                Appears normal  Posterior Fossa:       Appears normal         Abdominal Wall:         Not well visualized  Nuchal Fold:           Not applicable (>20    Cord Vessels:           Appears normal ([redacted]                         wks GA)                                        vessel cord)  Face:  Not well visualized    Kidneys:                Appear normal  Lips:                  Not well visualized    Bladder:                Appears normal  Thoracic:              Appears normal         Spine:                  Appears normal  Heart:                  Appears normal         Upper Extremities:      Appears normal                         (4CH, axis, and                         situs)  RVOT:                  Appears normal         Lower Extremities:      Appears normal  LVOT:                  Appears normal  Other:  Fetus appears to be a female. VC and 3VV visualized. Heels visualized.          Technically difficult due to fetal position. ---------------------------------------------------------------------- Cervix Uterus Adnexa  Cervix  Not visualized (advanced GA >24wks)  Uterus  No abnormality visualized.  Right Ovary  Two cystic areas, ovarian vs adnexal. Largest measures 6.7 x 4.4 x  3.5cm; Smaller 3.1 x 2.2 x.1.6cm.  Left Ovary  Within normal limits.  Cul De Sac  No free fluid seen.  Adnexa  No abnormality visualized. ---------------------------------------------------------------------- Impression  Single intrauterine pregnancy here for a detailed anatomy  due to A1GDM  Normal anatomy with measurements consistent with dates  There is good fetal movement and amniotic fluid volume  Suboptimal views of the fetal anatomy were obtained  secondary to fetal position and advanced gestational age.  There is a fluid collection vs right adnexal mass. It does not  cause discomfort to Hannah Maldonado. Her gallbladder is  surgically absent.  A marginal cord insertion was observed. ---------------------------------------------------------------------- Recommendations  Follow up growth in 4-6 week ----------------------------------------------------------------------               Lin Landsman, MD Electronically Signed Final Report   08/08/2022 04:22 pm ----------------------------------------------------------------------   Assessment:  Hannah Maldonado is a 37 y.o. G2P1001 female at [redacted]w[redacted]d with SROM.   Plan:  1. Admit to Labor & Delivery; consents reviewed and obtained - Dr. Feliberto Gottron notified of admission  2. Fetal Well being  - Fetal  Tracing: Category I tracing - Group B Streptococcus ppx indicated: GBS collected by PCR, will treat pending results - Presentation: vertex confirmed by bedside US   3. Routine OB: - Prenatal labs reviewed, as above - Rh pos - CBC, T&S, RPR on admit - Clear fluids, saline lock  4. Monitoring of Labor -  Contractions q3-49min, external toco in place -  Pelvis proven to 3270g -  Plan for continuous fetal monitoring  -  Maternal pain  control as desired; prefers to go unmedicated but is open to an epidural - Anticipate vaginal delivery  5. Post Partum Planning: - Infant feeding: breastfeeding and bottlefeeding - Contraception: BTL - Tdap: 07/21/22 - Flu: 07/21/22  Gertie Fey, CNM 08/31/22 2:25 PM

## 2022-08-31 NOTE — OB Triage Note (Signed)
Pt G2P1 [redacted]w[redacted]d presents by EMS for leaking of fluid. Pt reports large gush of watery clear and bloody fluid around 11am. Pt reports +FM. Denies ctx and reports no pain. Pt reports she takes metformin for GDM but hasn't taken in 2 days.

## 2022-08-31 NOTE — Progress Notes (Signed)
Labor Progress Note  Hannah Maldonado is a 36 y.o. G2P1001 at [redacted]w[redacted]d by LMP admitted for rupture of membranes  Subjective: she reports contractions are stronger and she feels them low in her pelvis  Objective: BP (!) 103/57 (BP Location: Left Arm)   Pulse 72   Temp 98.1 F (36.7 C) (Oral)   Resp 16   Ht 5' 2.1" (1.577 m)   Wt 68.9 kg   LMP 12/21/2021   BMI 27.71 kg/m  Notable VS details: reviewed  Fetal Assessment: FHT:  FHR: 135 bpm, variability: moderate,  accelerations:  Present,  decelerations:  Absent Category/reactivity:  Category I UC:   regular, every 3-5 minutes SVE:    Dilation: 4cm  Effacement: 70%  Station:  -2  Consistency: soft  Position: middle and tilted to patient's left  Membrane status:SROM @ 1100 Amniotic color: clear, bloody  Labs: Lab Results  Component Value Date   WBC 8.3 08/31/2022   HGB 12.3 08/31/2022   HCT 35.5 (L) 08/31/2022   MCV 87.7 08/31/2022   PLT 221 08/31/2022    Assessment / Plan: 36-year-old G2P1001 at [redacted]w[redacted]d here with PPROM and uterine contractions  Labor:  She is making normal cervical change, feels that her contractions are stronger. Pitocin currently 6mu/min. Bloody show has decreased, baby remains category I tracing, mother denies any pain or dizziness Preeclampsia:  normal BP Fetal Wellbeing:  Category I Pain Control:  Labor support without medications I/D:   GBS negative, SROM x 11 hours Anticipated MOD:  NSVD  Ayiden Milliman Renee Donnabelle Blanchard, CNM 08/31/2022, 9:59 PM         

## 2022-08-31 NOTE — H&P (View-Only) (Signed)
Labor Progress Note  Winola Drum is a 37 y.o. G2P1001 at [redacted]w[redacted]d by LMP admitted for rupture of membranes  Subjective: she reports contractions are stronger and she feels them low in her pelvis  Objective: BP (!) 103/57 (BP Location: Left Arm)   Pulse 72   Temp 98.1 F (36.7 C) (Oral)   Resp 16   Ht 5' 2.1" (1.577 m)   Wt 68.9 kg   LMP 12/21/2021   BMI 27.71 kg/m  Notable VS details: reviewed  Fetal Assessment: FHT:  FHR: 135 bpm, variability: moderate,  accelerations:  Present,  decelerations:  Absent Category/reactivity:  Category I UC:   regular, every 3-5 minutes SVE:    Dilation: 4cm  Effacement: 70%  Station:  -2  Consistency: soft  Position: middle and tilted to patient's left  Membrane status:SROM @ 1100 Amniotic color: clear, bloody  Labs: Lab Results  Component Value Date   WBC 8.3 08/31/2022   HGB 12.3 08/31/2022   HCT 35.5 (L) 08/31/2022   MCV 87.7 08/31/2022   PLT 221 08/31/2022    Assessment / Plan: 37 year old G2P1001 at [redacted]w[redacted]d here with PPROM and uterine contractions  Labor:  She is making normal cervical change, feels that her contractions are stronger. Pitocin currently 24mu/min. Bloody show has decreased, baby remains category I tracing, mother denies any pain or dizziness Preeclampsia:  normal BP Fetal Wellbeing:  Category I Pain Control:  Labor support without medications I/D:   GBS negative, SROM x 11 hours Anticipated MOD:  NSVD  Gertie Fey, CNM 08/31/2022, 9:59 PM

## 2022-08-31 NOTE — Progress Notes (Signed)
Labor Progress Note  Hannah Maldonado is a 37 y.o. G2P1001 at [redacted]w[redacted]d by LMP admitted for rupture of membranes  Subjective: she feels occasional uterine tightening but denies any pain or discomfort  Objective: BP (!) 103/56 (BP Location: Left Arm)   Pulse 78   Temp 98.2 F (36.8 C) (Oral)   Resp 15   Ht 5' 2.1" (1.577 m)   Wt 68.9 kg   LMP 12/21/2021   BMI 27.71 kg/m  Notable VS details: reviewed  Fetal Assessment: FHT:  FHR: 135 bpm, variability: moderate,  accelerations:  Present,  decelerations:  Absent Category/reactivity:  Category I UC:   regular, every 5-8 minutes SVE:    Dilation: 3cm  Effacement: 50%  Station:  -2  Consistency: soft  Position: posterior  Membrane status:SROM @ 1100 Amniotic color: clear, bloody  Labs: Lab Results  Component Value Date   WBC 8.3 08/31/2022   HGB 12.3 08/31/2022   HCT 35.5 (L) 08/31/2022   MCV 87.7 08/31/2022   PLT 221 08/31/2022    Assessment / Plan: 37 year old G2P1001 at [redacted]w[redacted]d here with PPROM and uterine contractions  Labor:  She progressed from 1/thick/-3 to 3/50/-2. Her contractions have spaced out and she reports they are less intense. Discussed augmenting with pitocin and she is agreeable.  Bloody show is heavy amounts, baby remains category I tracing, mother denies any pain or dizziness. Dr. Ouida Sills updated on labor progress Preeclampsia:  normal BP Fetal Wellbeing:  Category I Pain Control:  Labor support without medications I/D:   GBS negative, SROM x 7 hours GDM: blood sugar q4hrs until active labor, blood sugar at 1700: 78 Anticipated MOD:  NSVD  Gertie Fey, CNM 08/31/2022, 6:02 PM

## 2022-09-01 ENCOUNTER — Inpatient Hospital Stay: Payer: BC Managed Care – PPO | Admitting: Anesthesiology

## 2022-09-01 ENCOUNTER — Encounter: Payer: Self-pay | Admitting: Obstetrics and Gynecology

## 2022-09-01 ENCOUNTER — Encounter
Admission: EM | Disposition: A | Discharge status: Home / Self Care | Payer: Self-pay | Source: Home / Self Care | Attending: Certified Nurse Midwife

## 2022-09-01 DIAGNOSIS — O26893 Other specified pregnancy related conditions, third trimester: Secondary | ICD-10-CM | POA: Diagnosis not present

## 2022-09-01 HISTORY — PX: TUBAL LIGATION: SHX77

## 2022-09-01 LAB — GLUCOSE, CAPILLARY
Glucose-Capillary: 77 mg/dL (ref 70–99)
Glucose-Capillary: 83 mg/dL (ref 70–99)
Glucose-Capillary: 75 mg/dL (ref 70–99)
Glucose-Capillary: 58 mg/dL — ABNORMAL LOW (ref 70–99)
Glucose-Capillary: 121 mg/dL — ABNORMAL HIGH (ref 70–99)
Glucose-Capillary: 84 mg/dL (ref 70–99)
Glucose-Capillary: 151 mg/dL — ABNORMAL HIGH (ref 70–99)

## 2022-09-01 LAB — RPR: RPR Ser Ql: NONREACTIVE

## 2022-09-01 LAB — HIV ANTIBODY (ROUTINE TESTING W REFLEX): HIV Screen 4th Generation wRfx: NONREACTIVE

## 2022-09-01 SURGERY — LIGATION, FALLOPIAN TUBE, POSTPARTUM
Anesthesia: General | Site: Abdomen | Laterality: Bilateral

## 2022-09-01 MED ORDER — COCONUT OIL OIL
1.0000 | TOPICAL_OIL | Status: DC | PRN
  Filled 2022-09-01: qty 7.5

## 2022-09-01 MED ORDER — LACTATED RINGERS IV SOLN: INTRAVENOUS | Status: DC

## 2022-09-01 MED ORDER — MIDAZOLAM HCL 2 MG/2ML IJ SOLN
INTRAMUSCULAR | Status: DC | PRN
  Administered 2022-09-01: 2 mg via INTRAVENOUS

## 2022-09-01 MED ORDER — DEXAMETHASONE SODIUM PHOSPHATE 10 MG/ML IJ SOLN
INTRAMUSCULAR | Status: AC
  Filled 2022-09-01: qty 1

## 2022-09-01 MED ORDER — ONDANSETRON HCL 4 MG PO TABS: 4.0000 mg | ORAL_TABLET | ORAL | Status: DC | PRN

## 2022-09-01 MED ORDER — ONDANSETRON HCL 4 MG/2ML IJ SOLN
INTRAMUSCULAR | Status: DC | PRN
  Administered 2022-09-01: 4 mg via INTRAVENOUS

## 2022-09-01 MED ORDER — ROCURONIUM BROMIDE 100 MG/10ML IV SOLN
INTRAVENOUS | Status: DC | PRN
  Administered 2022-09-01: 20 mg via INTRAVENOUS

## 2022-09-01 MED ORDER — BUPIVACAINE HCL (PF) 0.5 % IJ SOLN
INTRAMUSCULAR | Status: AC
  Filled 2022-09-01: qty 30

## 2022-09-01 MED ORDER — DEXAMETHASONE SODIUM PHOSPHATE 10 MG/ML IJ SOLN
INTRAMUSCULAR | Status: DC | PRN
  Administered 2022-09-01: 10 mg via INTRAVENOUS

## 2022-09-01 MED ORDER — SIMETHICONE 80 MG PO CHEW
80.0000 mg | CHEWABLE_TABLET | ORAL | Status: DC | PRN
  Administered 2022-09-01: 80 mg via ORAL
  Filled 2022-09-01: qty 1

## 2022-09-01 MED ORDER — PHENYLEPHRINE HCL (PRESSORS) 10 MG/ML IV SOLN
INTRAVENOUS | Status: DC | PRN
  Administered 2022-09-01 (×3): 80 ug via INTRAVENOUS
  Administered 2022-09-01: 160 ug via INTRAVENOUS
  Administered 2022-09-01: 80 ug via INTRAVENOUS

## 2022-09-01 MED ORDER — PRENATAL MULTIVITAMIN CH
1.0000 | ORAL_TABLET | Freq: Every day | ORAL | Status: DC
  Administered 2022-09-01 – 2022-09-02 (×2): 1 via ORAL
  Filled 2022-09-01 (×2): qty 1

## 2022-09-01 MED ORDER — SENNOSIDES-DOCUSATE SODIUM 8.6-50 MG PO TABS
2.0000 | ORAL_TABLET | Freq: Every day | ORAL | Status: DC
  Administered 2022-09-02: 2 via ORAL
  Filled 2022-09-01: qty 2

## 2022-09-01 MED ORDER — KETOROLAC TROMETHAMINE 30 MG/ML IJ SOLN
INTRAMUSCULAR | Status: AC
  Filled 2022-09-01: qty 1

## 2022-09-01 MED ORDER — KETOROLAC TROMETHAMINE 30 MG/ML IJ SOLN
INTRAMUSCULAR | Status: DC | PRN
  Administered 2022-09-01: 30 mg via INTRAVENOUS

## 2022-09-01 MED ORDER — FENTANYL CITRATE (PF) 100 MCG/2ML IJ SOLN
INTRAMUSCULAR | Status: AC
  Filled 2022-09-01: qty 2

## 2022-09-01 MED ORDER — IBUPROFEN 600 MG PO TABS
600.0000 mg | ORAL_TABLET | Freq: Four times a day (QID) | ORAL | Status: DC
  Administered 2022-09-01: 600 mg via ORAL

## 2022-09-01 MED ORDER — LIDOCAINE HCL (PF) 2 % IJ SOLN
INTRAMUSCULAR | Status: AC
  Filled 2022-09-01: qty 5

## 2022-09-01 MED ORDER — FENTANYL CITRATE (PF) 100 MCG/2ML IJ SOLN
INTRAMUSCULAR | Status: DC | PRN
  Administered 2022-09-01: 100 ug via INTRAVENOUS

## 2022-09-01 MED ORDER — DIPHENHYDRAMINE HCL 25 MG PO CAPS: 25.0000 mg | ORAL_CAPSULE | Freq: Four times a day (QID) | ORAL | Status: DC | PRN

## 2022-09-01 MED ORDER — IBUPROFEN 600 MG PO TABS
600.0000 mg | ORAL_TABLET | Freq: Four times a day (QID) | ORAL | Status: DC
  Administered 2022-09-01 – 2022-09-02 (×5): 600 mg via ORAL
  Filled 2022-09-01 (×5): qty 1

## 2022-09-01 MED ORDER — DIBUCAINE (PERIANAL) 1 % EX OINT: 1.0000 | TOPICAL_OINTMENT | CUTANEOUS | Status: DC | PRN

## 2022-09-01 MED ORDER — LEVOTHYROXINE SODIUM 200 MCG PO TABS
200.0000 ug | ORAL_TABLET | Freq: Every day | ORAL | Status: DC
  Administered 2022-09-01 – 2022-09-02 (×2): 200 ug via ORAL
  Filled 2022-09-01 (×2): qty 1

## 2022-09-01 MED ORDER — WITCH HAZEL-GLYCERIN EX PADS
1.0000 | MEDICATED_PAD | CUTANEOUS | Status: DC | PRN
  Filled 2022-09-01: qty 100

## 2022-09-01 MED ORDER — BUPIVACAINE HCL 0.5 % IJ SOLN
INTRAMUSCULAR | Status: DC | PRN
  Administered 2022-09-01: 14 mL

## 2022-09-01 MED ORDER — ZOLPIDEM TARTRATE 5 MG PO TABS: 5.0000 mg | ORAL_TABLET | Freq: Every evening | ORAL | Status: DC | PRN

## 2022-09-01 MED ORDER — OXYCODONE HCL 5 MG PO TABS: 5.0000 mg | ORAL_TABLET | Freq: Once | ORAL | Status: DC | PRN

## 2022-09-01 MED ORDER — SUGAMMADEX SODIUM 200 MG/2ML IV SOLN
INTRAVENOUS | Status: DC | PRN
  Administered 2022-09-01: 200 mg via INTRAVENOUS

## 2022-09-01 MED ORDER — FAMOTIDINE 20 MG PO TABS
40.0000 mg | ORAL_TABLET | Freq: Once | ORAL | Status: AC
  Administered 2022-09-01: 40 mg via ORAL
  Filled 2022-09-01: qty 2

## 2022-09-01 MED ORDER — PROPOFOL 10 MG/ML IV BOLUS
INTRAVENOUS | Status: DC | PRN
  Administered 2022-09-01: 120 mg via INTRAVENOUS

## 2022-09-01 MED ORDER — SEVOFLURANE IN SOLN
RESPIRATORY_TRACT | Status: AC
  Filled 2022-09-01: qty 250

## 2022-09-01 MED ORDER — DEXTROSE 50 % IV SOLN
25.0000 mL | Freq: Once | INTRAVENOUS | Status: AC
  Administered 2022-09-01: 25 mL via INTRAVENOUS

## 2022-09-01 MED ORDER — LIDOCAINE HCL (CARDIAC) PF 100 MG/5ML IV SOSY
PREFILLED_SYRINGE | INTRAVENOUS | Status: DC | PRN
  Administered 2022-09-01: 80 mg via INTRAVENOUS

## 2022-09-01 MED ORDER — IBUPROFEN 600 MG PO TABS
ORAL_TABLET | ORAL | Status: AC
  Filled 2022-09-01: qty 1

## 2022-09-01 MED ORDER — DEXTROSE 50 % IV SOLN
INTRAVENOUS | Status: AC
  Filled 2022-09-01: qty 50

## 2022-09-01 MED ORDER — 0.9 % SODIUM CHLORIDE (POUR BTL) OPTIME
TOPICAL | Status: DC | PRN
  Administered 2022-09-01: 500 mL

## 2022-09-01 MED ORDER — PHENYLEPHRINE 80 MCG/ML (10ML) SYRINGE FOR IV PUSH (FOR BLOOD PRESSURE SUPPORT)
PREFILLED_SYRINGE | INTRAVENOUS | Status: AC
  Filled 2022-09-01: qty 10

## 2022-09-01 MED ORDER — BENZOCAINE-MENTHOL 20-0.5 % EX AERO
1.0000 | INHALATION_SPRAY | CUTANEOUS | Status: DC | PRN
  Filled 2022-09-01: qty 56

## 2022-09-01 MED ORDER — ONDANSETRON HCL 4 MG/2ML IJ SOLN
INTRAMUSCULAR | Status: AC
  Filled 2022-09-01: qty 2

## 2022-09-01 MED ORDER — SUCCINYLCHOLINE CHLORIDE 200 MG/10ML IV SOSY
PREFILLED_SYRINGE | INTRAVENOUS | Status: DC | PRN
  Administered 2022-09-01: 100 mg via INTRAVENOUS

## 2022-09-01 MED ORDER — MIDAZOLAM HCL 2 MG/2ML IJ SOLN
INTRAMUSCULAR | Status: AC
  Filled 2022-09-01: qty 2

## 2022-09-01 MED ORDER — METOCLOPRAMIDE HCL 10 MG PO TABS
10.0000 mg | ORAL_TABLET | Freq: Once | ORAL | Status: AC
  Administered 2022-09-01: 10 mg via ORAL
  Filled 2022-09-01: qty 1

## 2022-09-01 MED ORDER — ACETAMINOPHEN 325 MG PO TABS
650.0000 mg | ORAL_TABLET | ORAL | Status: DC | PRN
  Administered 2022-09-01 – 2022-09-02 (×3): 650 mg via ORAL
  Filled 2022-09-01 (×3): qty 2

## 2022-09-01 MED ORDER — ONDANSETRON HCL 4 MG/2ML IJ SOLN: 4.0000 mg | INTRAMUSCULAR | Status: DC | PRN

## 2022-09-01 MED ORDER — OXYCODONE HCL 5 MG/5ML PO SOLN: 5.0000 mg | Freq: Once | ORAL | Status: DC | PRN

## 2022-09-01 MED ORDER — FENTANYL CITRATE (PF) 100 MCG/2ML IJ SOLN: 25.0000 ug | INTRAMUSCULAR | Status: DC | PRN

## 2022-09-01 MED ORDER — ROCURONIUM BROMIDE 10 MG/ML (PF) SYRINGE
PREFILLED_SYRINGE | INTRAVENOUS | Status: AC
  Filled 2022-09-01: qty 10

## 2022-09-01 MED ORDER — OXYCODONE HCL 5 MG PO TABS
5.0000 mg | ORAL_TABLET | ORAL | Status: DC | PRN
  Administered 2022-09-01 – 2022-09-02 (×4): 5 mg via ORAL
  Filled 2022-09-01 (×4): qty 1

## 2022-09-01 MED ORDER — PROPOFOL 10 MG/ML IV BOLUS
INTRAVENOUS | Status: AC
  Filled 2022-09-01: qty 20

## 2022-09-01 SURGICAL SUPPLY — 36 items
BINDER ABDOMINAL  9 SM 30-45 (SOFTGOODS) ×1
BINDER ABDOMINAL 9 SM 30-45 (SOFTGOODS) IMPLANT
BLADE SURG SZ11 CARB STEEL (BLADE) ×1 IMPLANT
CHLORAPREP W/TINT 26 (MISCELLANEOUS) ×1 IMPLANT
DERMABOND ADVANCED .7 DNX12 (GAUZE/BANDAGES/DRESSINGS) ×1 IMPLANT
DRAPE LAPAROTOMY 100X77 ABD (DRAPES) ×1 IMPLANT
ELECT REM PT RETURN 9FT ADLT (ELECTROSURGICAL) ×1
ELECTRODE REM PT RTRN 9FT ADLT (ELECTROSURGICAL) ×1 IMPLANT
GAUZE 4X4 16PLY ~~LOC~~+RFID DBL (SPONGE) ×1 IMPLANT
GLOVE BIO SURGEON STRL SZ7 (GLOVE) ×1 IMPLANT
GLOVE SURG UNDER LTX SZ7.5 (GLOVE) ×1 IMPLANT
GOWN STRL REUS W/ TWL LRG LVL3 (GOWN DISPOSABLE) ×1 IMPLANT
GOWN STRL REUS W/TWL LRG LVL3 (GOWN DISPOSABLE) ×1
KIT TURNOVER CYSTO (KITS) ×1 IMPLANT
LABEL OR SOLS (LABEL) ×1 IMPLANT
LIGASURE IMPACT 36 18CM CVD LR (INSTRUMENTS) IMPLANT
MANIFOLD NEPTUNE II (INSTRUMENTS) ×1 IMPLANT
NEEDLE HYPO 22GX1.5 SAFETY (NEEDLE) ×1 IMPLANT
NS IRRIG 500ML POUR BTL (IV SOLUTION) ×1 IMPLANT
PACK BASIN MINOR ARMC (MISCELLANEOUS) ×1 IMPLANT
PAD OB MATERNITY 4.3X12.25 (PERSONAL CARE ITEMS) ×1 IMPLANT
RETRACTOR RING XSMALL (MISCELLANEOUS) ×1 IMPLANT
RTRCTR WOUND ALEXIS 13CM XS SH (MISCELLANEOUS) ×1
SCRUB CHG 4% DYNA-HEX 4OZ (MISCELLANEOUS) ×1 IMPLANT
SPONGE T-LAP 4X18 ~~LOC~~+RFID (SPONGE) ×1 IMPLANT
SUT CHROMIC GUT BROWN 0 54 (SUTURE) ×1 IMPLANT
SUT CHROMIC GUT BROWN 0 54IN (SUTURE) ×1
SUT MNCRL 4-0 (SUTURE) ×1
SUT MNCRL 4-0 27XMFL (SUTURE) ×1
SUT VIC AB 0 CT2 27 (SUTURE) ×1 IMPLANT
SUT VICRYL 0 AB UR-6 (SUTURE) IMPLANT
SUT VICRYL 0 UR6 27IN ABS (SUTURE) ×2 IMPLANT
SUTURE MNCRL 4-0 27XMF (SUTURE) ×1 IMPLANT
SYR 10ML LL (SYRINGE) ×1 IMPLANT
TRAP FLUID SMOKE EVACUATOR (MISCELLANEOUS) ×1 IMPLANT
WATER STERILE IRR 500ML POUR (IV SOLUTION) ×1 IMPLANT

## 2022-09-01 NOTE — Interval H&P Note (Signed)
History and Physical Interval Note:  09/01/2022 11:53 AM  Hannah Maldonado  has presented today for surgery, with the diagnosis of desires permanant sterilization.  The various methods of treatment have been discussed with the patient and family. After consideration of risks, benefits and other options for treatment, the patient has consented to  Procedure(s): POST PARTUM TUBAL LIGATION (Bilateral) as a surgical intervention.  The patient's history has been reviewed, patient examined, no change in status, stable for surgery.  I have reviewed the patient's chart and labs.  Questions were answered to the patient's satisfaction.     Benjaman Kindler

## 2022-09-01 NOTE — Discharge Summary (Signed)
Obstetrical Discharge Summary  Patient Name: Hannah Maldonado DOB: 05-21-1986 MRN: [id number]  Date of Admission: 08/31/2022 Date of Delivery: 08/31/2022 Delivered by: Lucrezia Europe, CNM Date of Discharge: 09/02/2022  Primary OB: Princella Ion [id number] last menstrual period was 12/21/2021. EDC Estimated Date of Delivery: 09/27/22 Gestational Age at Delivery: 109w2d   Antepartum complications:  1. GDM A2 (taking Metformin 500mg ) 2. Hyperthyroid, s/p thyroidectomy, now hypothyroid, taking Synthroid 259mcg 3. Endometritis after delivery in 2021  Admitting Diagnosis: Amniotic fluid leaking [O42.90]  Secondary Diagnosis: NSVD Patient Active Problem List   Diagnosis Date Noted   GDM, class A2 08/31/2022   Hypothyroid in pregnancy, antepartum 08/08/2022   Acute cholecystitis 03/23/2020   Postpartum endometritis 03/18/2020   NSVD (normal spontaneous vaginal delivery) 02/26/2020    Discharge Diagnosis: Preterm Pregnancy Delivered      Augmentation: Pitocin Complications: None Intrapartum complications/course: She arrived with SROM and was augmented with pitocin. She progressed to 10/100/+3 and pushed 90min, delivering viable female infant over 1st degree laceration. Apgars 8/9. Delivery Type: spontaneous vaginal delivery Anesthesia: non-pharmacological methods Placenta: spontaneous To Pathology: Yes  Laceration: 1st degree Episiotomy: none Newborn Data: Live born female  Birth Weight:  5lbs 11.4oz APGAR: 8, 9  Newborn Delivery   Birth date/time: 09/01/2022 01:46:00 Delivery type: Vaginal, Spontaneous      Postpartum Procedures: P.P. tubal ligation  Edinburgh:     09/01/2022   10:19 AM 02/27/2020    3:00 PM  Edinburgh Postnatal Depression Scale Screening Tool  I have been able to laugh and see the funny side of things. 0 0  I have looked forward with enjoyment to things. 0 0  I have blamed myself unnecessarily when things went wrong. 1 0  I have been anxious or  worried for no good reason. 0 0  I have felt scared or panicky for no good reason. 0 0  Things have been getting on top of me. 0 1  I have been so unhappy that I have had difficulty sleeping. 0 0  I have felt sad or miserable. 0 0  I have been so unhappy that I have been crying. 0 0  The thought of harming myself has occurred to me. 0 0  Edinburgh Postnatal Depression Scale Total 1 1     Post partum course:  Patient had an uncomplicated postpartum course.  By time of discharge on PPD#1, her pain was controlled on oral pain medications; she had appropriate lochia and was ambulating, voiding without difficulty and tolerating regular diet.  She was deemed stable for discharge to home.    Discharge Physical Exam:  BP 111/77 (BP Location: Right Arm)   Pulse 60   Temp 97.7 F (36.5 C)   Resp 18   Ht 5' 2.1" (1.577 m)   Wt 68.9 kg   LMP 12/21/2021 Comment: Delivered 09/01/22  SpO2 97%   Breastfeeding Unknown   BMI 27.71 kg/m   General: NAD CV: RRR Pulm: CTABL, nl effort ABD: s/nd/nt, fundus firm and below the umbilicus Lochia: moderate Perineum: minimal edema/intact Incision: c/d/I, incision covered with dermabond  DVT Evaluation: LE non-ttp, no evidence of DVT on exam.  Hemoglobin  Date Value Ref Range Status  09/02/2022 11.1 (L) 12.0 - 15.0 g/dL Final   HCT  Date Value Ref Range Status  09/02/2022 33.1 (L) 36.0 - 46.0 % Final    Risk assessment for postpartum VTE and prophylactic treatment: Very high risk factors: None High risk factors: None Moderate risk factors: None  Postpartum VTE  prophylaxis with LMWH not indicated  Disposition: stable, discharge to home. Baby Feeding: breast and formula feeding Baby Disposition: home with mom  Rh Immune globulin indicated: No Rubella vaccine given: was not indicated Varivax vaccine given: was not indicated Flu vaccine given in AP setting: Yes  Tdap vaccine given in AP setting: Yes   Contraception: bilateral tubal  ligation  Prenatal Labs:  Blood type/Rh O pos  Antibody screen neg  Rubella Immune  Varicella Immune  RPR NR  HBsAg Neg  HIV NR  GC neg  Chlamydia neg  Genetic screening negative  1 hour GTT 150  3 hour GTT 73, 180, 158, 152  GBS Pending by PCR    Plan:  Verdis Frederickson Trujillo-Gonzalez was discharged to home in good condition. Follow-up appointment with Dr. Leafy Ro in 2 weeks for post-op appointment.  Discharge Medications: Allergies as of 09/02/2022       Reactions   Torecan [thiethylperazine] Other (See Comments)   Muscle spasms-Paralyzed  neck muscles        Medication List     TAKE these medications    acetaminophen 325 MG tablet Commonly known as: Tylenol Take 2 tablets (650 mg total) by mouth every 4 (four) hours as needed (for pain scale < 4).   ibuprofen 600 MG tablet Commonly known as: ADVIL Take 1 tablet (600 mg total) by mouth every 6 (six) hours as needed.   levothyroxine 125 MCG tablet Commonly known as: SYNTHROID Take 1 tablet (125 mcg total) by mouth daily before breakfast. What changed:  how much to take additional instructions   metFORMIN 500 MG tablet Commonly known as: GLUCOPHAGE Take 500 mg by mouth 1 day or 1 dose.   prenatal multivitamin Tabs tablet Take 1 tablet by mouth daily at 12 noon.         Follow-up Information     Gertie Fey, CNM Follow up in 6 week(s).   Specialty: Certified Nurse Midwife Why: 6wk postpartum Contact information: [place] [address] [phone]         Benjaman Kindler, MD Follow up.   Specialty: Obstetrics and Gynecology Why: video visit in 2 weeks for postop visit Contact information: [place] [address] [phone]                 Signed: Terance Ice 09/02/2022  Drinda Butts, CNM Certified Nurse Midwife [facility]

## 2022-09-01 NOTE — Transfer of Care (Signed)
Immediate Anesthesia Transfer of Care Note  Patient: Verdis Frederickson Trujillo-Gonzalez  Procedure(s) Performed: POST PARTUM TUBAL LIGATION (Bilateral: Abdomen)  Patient Location: PACU  Anesthesia Type:General  Level of Consciousness: drowsy  Airway & Oxygen Therapy: Patient Spontanous Breathing and Patient connected to face mask oxygen  Post-op Assessment: Report given to RN and Post -op Vital signs reviewed and stable  Post vital signs: stable  Last Vitals:  Vitals Value Taken Time  BP 97/64 09/01/22 1315  Temp    Pulse 72 09/01/22 1318  Resp 18 09/01/22 1318  SpO2 97 % 09/01/22 1318  Vitals shown include unvalidated device data.  Last Pain:  Vitals:   09/01/22 1108  TempSrc: Temporal  PainSc: 0-No pain      Patients Stated Pain Goal: 0 (22/29/79 8921)  Complications: No notable events documented.

## 2022-09-01 NOTE — Anesthesia Preprocedure Evaluation (Signed)
Anesthesia Evaluation  Patient identified by MRN, date of birth, ID band Patient awake    Reviewed: Allergy & Precautions, NPO status , Patient's Chart, lab work & pertinent test results  History of Anesthesia Complications Negative for: history of anesthetic complications  Airway Mallampati: III  TM Distance: >3 FB Neck ROM: full    Dental  (+) Chipped   Pulmonary neg pulmonary ROS, neg shortness of breath   Pulmonary exam normal        Cardiovascular (-) angina negative cardio ROS Normal cardiovascular exam     Neuro/Psych negative neurological ROS  negative psych ROS   GI/Hepatic negative GI ROS, Neg liver ROS,,,  Endo/Other  diabetesHypothyroidism    Renal/GU      Musculoskeletal   Abdominal   Peds  Hematology negative hematology ROS (+)   Anesthesia Other Findings Past Medical History: No date: Anxiety No date: Depression No date: Hypothyroidism No date: Thyroid disease  Past Surgical History: 03/19/2020: CHOLECYSTECTOMY No date: THYROIDECTOMY  BMI    Body Mass Index: 27.71 kg/m      Reproductive/Obstetrics                             Anesthesia Physical Anesthesia Plan  ASA: 3  Anesthesia Plan: General ETT   Post-op Pain Management:    Induction: Intravenous  PONV Risk Score and Plan: Ondansetron, Dexamethasone, Midazolam and Treatment may vary due to age or medical condition  Airway Management Planned: Oral ETT  Additional Equipment:   Intra-op Plan:   Post-operative Plan: Extubation in OR  Informed Consent: I have reviewed the patients History and Physical, chart, labs and discussed the procedure including the risks, benefits and alternatives for the proposed anesthesia with the patient or authorized representative who has indicated his/her understanding and acceptance.     Dental Advisory Given  Plan Discussed with: Anesthesiologist, CRNA and  Surgeon  Anesthesia Plan Comments: (Patient declines interpreter  Patient consented for risks of anesthesia including but not limited to:  - adverse reactions to medications - damage to eyes, teeth, lips or other oral mucosa - nerve damage due to positioning  - sore throat or hoarseness - Damage to heart, brain, nerves, lungs, other parts of body or loss of life  Patient voiced understanding.)       Anesthesia Quick Evaluation

## 2022-09-01 NOTE — Anesthesia Procedure Notes (Signed)
Procedure Name: Intubation Date/Time: 09/01/2022 12:38 PM  Performed by: Natasha Mead, CRNAPre-anesthesia Checklist: Patient identified, Emergency Drugs available, Suction available and Patient being monitored Patient Re-evaluated:Patient Re-evaluated prior to induction Oxygen Delivery Method: Circle system utilized Preoxygenation: Pre-oxygenation with 100% oxygen Induction Type: IV induction Ventilation: Mask ventilation without difficulty Laryngoscope Size: Miller and 2 Grade View: Grade I Tube type: Oral Tube size: 7.0 mm Number of attempts: 1 Airway Equipment and Method: Stylet and Oral airway Placement Confirmation: ETT inserted through vocal cords under direct vision, positive ETCO2 and breath sounds checked- equal and bilateral Secured at: 21 cm Tube secured with: Tape Dental Injury: Teeth and Oropharynx as per pre-operative assessment

## 2022-09-01 NOTE — Anesthesia Postprocedure Evaluation (Signed)
Anesthesia Post Note  Patient: Hannah Maldonado  Procedure(s) Performed: POST PARTUM TUBAL LIGATION (Bilateral: Abdomen)  Patient location during evaluation: PACU Anesthesia Type: General Level of consciousness: awake and alert Pain management: pain level controlled Vital Signs Assessment: post-procedure vital signs reviewed and stable Respiratory status: spontaneous breathing, nonlabored ventilation, respiratory function stable and patient connected to nasal cannula oxygen Cardiovascular status: blood pressure returned to baseline and stable Postop Assessment: no apparent nausea or vomiting Anesthetic complications: no   No notable events documented.   Last Vitals:  Vitals:   09/01/22 1415 09/01/22 1438  BP: 105/71 102/71  Pulse: 65 63  Resp: 15 20  Temp: (!) 36.3 C 36.5 C  SpO2: 95% 98%    Last Pain:  Vitals:   09/01/22 1438  TempSrc: Oral  PainSc:                  [name]

## 2022-09-01 NOTE — Op Note (Signed)
Hannah Maldonado 09/01/2022  PREOPERATIVE DIAGNOSES: Multiparity, undesired fertility  POSTOPERATIVE DIAGNOSES: Multiparity, undesired fertility  PROCEDURE:  Postpartum Bilateral Tubal Sterilization by bilateral partial salpingectomy, including fimbriae  SURGEON: Dr. Benjaman Kindler  ANESTHESIA: GETA and local analgesia using 14 ml of 0.5% Marcaine  Anesthesiologist: Piscitello, Precious Haws, MD Anesthesiologist: Amie Critchley Precious Haws, MD CRNA: Natasha Mead, CRNA  COMPLICATIONS:  None immediate.  ESTIMATED BLOOD LOSS: minimal.  FLUIDS: 600 ml LR.  URINE OUTPUT:  Not assessed  INDICATIONS:  37 y.o. [id number] with undesired fertility,status post vaginal delivery, desires permanent sterilization.  Other reversible forms of contraception were discussed with patient; she declines all other modalities. Risks of procedure discussed with patient including but not limited to: risk of regret, permanence of method, bleeding, infection, injury to surrounding organs and need for additional procedures.  Failure risk of 1 -2 % with increased risk of ectopic gestation if pregnancy occurs was also discussed with patient.      FINDINGS:  Normal uterus, tubes, and ovaries.  PROCEDURE DETAILS:  The patient was taken to the operating room where her epidural anesthesia was dosed up to surgical level and found to be adequate.  She was then placed in the dorsal supine position and prepped and draped in sterile fashion.  After an adequate timeout was performed, attention was turned to the patient's abdomen where a small transverse skin incision was made under the umbilical fold. The incision was taken down to the layer of fascia using the scalpel, and fascia was incised, and extended bilaterally using Mayo scissors. The peritoneum was entered in a sharp fashion. Attention was then turned to the patient's uterus, and left fallopian tube was identified and followed out to the fimbriated end. Using a Ligasure  bipolar cautery device, a portion of the left tube including the fimbriated end was cauterized and sharply excised.  A similar process was carried out on the right side allowing for bilateral tubal sterilization.  Good hemostasis was noted overall.  Local analgesia was poured over both adenexa.The instruments were then removed from the patient's abdomen and the fascial incision was repaired with 0 Vicryl, and the skin was closed with a 4-0 Vicryl subcuticular stitch. The patient tolerated the procedure well.  Instrument, sponge, and needle counts were correct times two.  The patient was then taken to the recovery room awake and in stable condition.

## 2022-09-01 NOTE — Progress Notes (Signed)
Pt CBG: 58. Dr. Amie Critchley notified. Acknowledged. Orders received. See [facility]

## 2022-09-01 NOTE — Lactation Note (Signed)
This note was copied from a baby's chart. Lactation Consultation Note  Patient Name: Hannah Maldonado [id number] Date: 09/01/2022 Reason for consult: Initial assessment;Late-preterm 34-36.6wks;Infant < 6lbs Age:35 hours  Maternal Data Has patient been taught Hand Expression?: Yes  P2, SVD 13 hours ago. Mom left for BTL this morning; just returning. [place] introduced herself and role of support of her and baby's feeding plan/preference. Declined interpreter at this time.  Discussed feeding plan and LPT feeding protocol recommendations.  Mom desires to BF and voiced her desire to only BF if possible.  Feeding Mother's Current Feeding Choice: Breast Milk and Formula Nipple Type: Slow - flow  Baby has primarily BF, formula given while mom was in surgery.  [place] spoke with mom about LPT protocol, sleepy behavior, weaker jaw muscles/tiring out at the breast before full feeding is accomplished, and recommendations for BF+supplement+pump. Mom reiterated desire to BF now.  LC strongly encouraged following feeding plan to help protect baby's sugar and bilirubin and prevent poor feeding. Mom voiced understanding, but again voiced desire to BF. At minimum we agreed to offer supplement if baby was too tired/sleepy to feed from breast, or if mom felt that baby did not feed strongly for a period of time from the breast, and to track void and stools.  LATCH Score  Encouraged to feed again shortly- within the next 30-29mins it will be 3hr mark since last feeding.  Lactation Tools Discussed/Used Tools: Pump (offered DEBP set-up; mom declined at this time) Breast pump type: Double-Electric Breast Pump Reason for Pumping: LPT protocol  Interventions Interventions: Breast feeding basics reviewed;Hand express;DEBP;Education (LPT protocol, feeding plan, early cues, feeding every 3 hours, tracking output)  Discharge    Consult Status Consult Status: Follow-up  Care RN updated.  Lavonia Drafts 09/01/2022, 3:15 PM

## 2022-09-01 NOTE — Progress Notes (Signed)
Pt's follow up CBG 121 at 1344.

## 2022-09-01 NOTE — Interval H&P Note (Signed)
History and Physical Interval Note:  09/01/2022 11:53 AM  Hannah Maldonado  has presented today for surgery, with the diagnosis of desires permanant sterilization.  The various methods of treatment have been discussed with the patient and family. After consideration of risks, benefits and other options for treatment, the patient has consented to  Procedure(s): POST PARTUM TUBAL LIGATION (Bilateral) as a surgical intervention.  The patient's history has been reviewed, patient examined, no change in status, stable for surgery.  I have reviewed the patient's chart and labs.  Questions were answered to the patient's satisfaction.    Patient was seen with Spanish interpreter and consented for permanent sterilization.    Benjaman Kindler

## 2022-09-02 LAB — GLUCOSE, CAPILLARY
Glucose-Capillary: 70 mg/dL (ref 70–99)
Glucose-Capillary: 87 mg/dL (ref 70–99)

## 2022-09-02 LAB — CBC
HCT: 33.1 % — ABNORMAL LOW (ref 36.0–46.0)
Hemoglobin: 11.1 g/dL — ABNORMAL LOW (ref 12.0–15.0)
MCH: 30.6 pg (ref 26.0–34.0)
MCHC: 33.5 g/dL (ref 30.0–36.0)
MCV: 91.2 fL (ref 80.0–100.0)
Platelets: 200 10*3/uL (ref 150–400)
RBC: 3.63 MIL/uL — ABNORMAL LOW (ref 3.87–5.11)
RDW: 13.7 % (ref 11.5–15.5)
WBC: 11.5 10*3/uL — ABNORMAL HIGH (ref 4.0–10.5)
nRBC: 0 % (ref 0.0–0.2)

## 2022-09-02 MED ORDER — IBUPROFEN 600 MG PO TABS: 600.0000 mg | ORAL_TABLET | Freq: Four times a day (QID) | ORAL | 0 refills | Status: DC | PRN

## 2022-09-02 MED ORDER — ACETAMINOPHEN 325 MG PO TABS: 650.0000 mg | ORAL_TABLET | ORAL | Status: DC | PRN

## 2022-09-02 NOTE — Progress Notes (Signed)
Verb understanding of d/c instructions using in house interpreter   D/C home with husband

## 2022-09-02 NOTE — Lactation Note (Signed)
This note was copied from a baby's chart. Lactation Consultation Note  Patient Name: Hannah Maldonado Date: 09/02/2022 Reason for consult: Follow-up assessment;Late-preterm 34-36.6wks;Infant < 6lbs Age:35 hours  Maternal Data Has patient been taught Hand Expression?: Yes Does the patient have breastfeeding experience prior to this delivery?: Yes How long did the patient breastfeed?: 2 yrs, stopped when knew she was pregnant  Feeding Mother's Current Feeding Choice: Breast Milk and Formula Awakened baby in crib by changing diaper, baby latched easily to left breast in cradle hold, needs stimulation to stay awake and continue nursing, some swallows noted, encouraged mom to offer both  breasts at a feeding, keep feedings to 15 min per side to decrease tiredness in baby and stimulate breast. Offer formula after each feeding to decrease wt loss until first ped visit.     LATCH Score Latch: Grasps breast easily, tongue down, lips flanged, rhythmical sucking.  Audible Swallowing: A few with stimulation  Type of Nipple: Everted at rest and after stimulation  Comfort (Breast/Nipple): Soft / non-tender  Hold (Positioning): No assistance needed to correctly position infant at breast.  LATCH Score: 9   Lactation Tools Discussed/Used    Interventions Interventions: Breast feeding basics reviewed;Assisted with latch;Hand express;Support pillows;Education;LPT handout/interventions  Discharge Pump: Manual WIC Program: Yes  Consult Status Consult Status: PRN Date: 09/02/22 Follow-up type: In-patient    Ferol Luz 09/02/2022, 11:15 AM

## 2022-09-02 NOTE — Discharge Instructions (Signed)
Vaginal Delivery, Care After Refer to this sheet in the next few weeks. These discharge instructions provide you with information on caring for yourself after delivery. Your caregiver may also give you specific instructions. Your treatment has been planned according to the most current medical practices available, but problems sometimes occur. Call your caregiver if you have any problems or questions after you go home. HOME CARE INSTRUCTIONS Take over-the-counter or prescription medicines only as directed by your caregiver or pharmacist. Do not drink alcohol, especially if you are breastfeeding or taking medicine to relieve pain. Do not smoke tobacco. Continue to use good perineal care. Good perineal care includes: Wiping your perineum from back to front Keeping your perineum clean. You can do sitz baths twice a day, to help keep this area clean Do not use tampons, douche or have sex until your caregiver says it is okay. Shower only and avoid sitting in submerged water, aside from sitz baths Wear a well-fitting bra that provides breast support. Eat healthy foods. Drink enough fluids to keep your urine clear or pale yellow. Eat high-fiber foods such as whole grain cereals and breads, brown rice, beans, and fresh fruits and vegetables every day. These foods may help prevent or relieve constipation. Avoid constipation with high fiber foods or medications, such as miralax or metamucil Follow your caregiver's recommendations regarding resumption of activities such as climbing stairs, driving, lifting, exercising, or traveling. Talk to your caregiver about resuming sexual activities. Resumption of sexual activities is dependent upon your risk of infection, your rate of healing, and your comfort and desire to resume sexual activity. Try to have someone help you with your household activities and your newborn for at least a few days after you leave the hospital. Rest as much as possible. Try to rest or  take a nap when your newborn is sleeping. Increase your activities gradually. Keep all of your scheduled postpartum appointments. It is very important to keep your scheduled follow-up appointments. At these appointments, your caregiver will be checking to make sure that you are healing physically and emotionally. SEEK MEDICAL CARE IF:  You are passing large clots from your vagina. Save any clots to show your caregiver. You have a foul smelling discharge from your vagina. You have trouble urinating. You are urinating frequently. You have pain when you urinate. You have a change in your bowel movements. You have increasing redness, pain, or swelling near your vaginal incision (episiotomy) or vaginal tear. You have pus draining from your episiotomy or vaginal tear. Your episiotomy or vaginal tear is separating. You have painful, hard, or reddened breasts. You have a severe headache. You have blurred vision or see spots. You feel sad or depressed. You have thoughts of hurting yourself or your newborn. You have questions about your care, the care of your newborn, or medicines. You are dizzy or light-headed. You have a rash. You have nausea or vomiting. You were breastfeeding and have not had a menstrual period within 12 weeks after you stopped breastfeeding. You are not breastfeeding and have not had a menstrual period by the 12th week after delivery. You have a fever. SEEK IMMEDIATE MEDICAL CARE IF:  You have persistent pain. You have chest pain. You have shortness of breath. You faint. You have leg pain. You have stomach pain. Your vaginal bleeding saturates two or more sanitary pads in 1 hour. MAKE SURE YOU:  Understand these instructions. Will watch your condition. Will get help right away if you are not doing well or   get worse. Document Released: 08/04/2000 Document Revised: 12/22/2013 Document Reviewed: 04/03/2012 ExitCare Patient Information 2015 [employer]. This  information is not intended to replace advice given to you by your health care provider. Make sure you discuss any questions you have with your health care provider.  Sitz Bath A sitz bath is a warm water bath taken in the sitting position. The water covers only the hips and butt (buttocks). We recommend using one that fits in the toilet, to help with ease of use and cleanliness. It may be used for either healing or cleaning purposes. Sitz baths are also used to relieve pain, itching, or muscle tightening (spasms). The water may contain medicine. Moist heat will help you heal and relax.  HOME CARE  Take 3 to 4 sitz baths a day. Fill the bathtub half-full with warm water. Sit in the water and open the drain a little. Turn on the warm water to keep the tub half-full. Keep the water running constantly. Soak in the water for 15 to 20 minutes. After the sitz bath, pat the affected area dry. GET HELP RIGHT AWAY IF: You get worse instead of better. Stop the sitz baths if you get worse. MAKE SURE YOU: Understand these instructions. Will watch your condition. Will get help right away if you are not doing well or get worse. Document Released: 09/14/2004 Document Revised: 05/01/2012 Document Reviewed: 12/05/2010 ExitCare Patient Information 2015 [employer]. This information is not intended to replace advice given to you by your health care provider. Make sure you discuss any questions you have with your health care provider.   

## 2022-09-02 NOTE — Progress Notes (Signed)
Postpartum Day  1  Subjective: no complaints, up ad lib, voiding, and tolerating PO  Doing well, no concerns. Ambulating without difficulty, pain managed with PO meds, tolerating regular diet, and voiding without difficulty.   No fever/chills, chest pain, shortness of breath, nausea/vomiting, or leg pain. No nipple or breast pain. No headache, visual changes, or RUQ/epigastric pain.  Objective: BP 94/62 (BP Location: Right Arm)   Pulse 69   Temp (!) 97.5 F (36.4 C) (Oral)   Resp 18   Ht 5' 2.1" (1.577 m)   Wt 68.9 kg   LMP 12/21/2021 Comment: Delivered 09/01/22  SpO2 97%   Breastfeeding Unknown   BMI 27.71 kg/m    Physical Exam:  General: alert, cooperative, and no distress Breasts: soft/nontender CV: RRR Pulm: nl effort, CTABL Abdomen: soft, non-tender, active bowel sounds Uterine Fundus: firm Incision: no significant drainage, no dehiscence, no significant erythema, covered with dermabond Perineum:  minimal edema, intact Lochia: appropriate DVT Evaluation: No evidence of DVT seen on physical exam.  Recent Labs    08/31/22 1443 09/02/22 0549  HGB 12.3 11.1*  HCT 35.5* 33.1*  WBC 8.3 11.5*  PLT 221 200    Assessment/Plan: 37 y.o. G2P1001 postpartum day # 1  -Continue routine postpartum care -Lactation consult PRN for breastfeeding  -Postpartum BTL completed 09/01/2022 by Dr. Leafy Ro.  Recovering well from surgery.  -Acute blood loss anemia - hemodynamically stable and asymptomatic; continue PO ferrous sulfate BID with stool softeners  -Immunization status:   all immunizations up to date   Disposition: Continue inpatient postpartum care. Desires discharge home today pending infant's disposition.    LOS: 2 days   Minda Meo, Mallory Shirk 09/02/2022, 9:08 AM   ----- Drinda Butts  Certified Nurse Midwife [facility] \

## 2022-09-04 ENCOUNTER — Other Ambulatory Visit: Payer: Self-pay

## 2022-09-04 LAB — SURGICAL PATHOLOGY

## 2022-09-06 ENCOUNTER — Ambulatory Visit: Payer: Self-pay

## 2022-09-06 LAB — SURGICAL PATHOLOGY

## 2022-09-06 NOTE — Lactation Note (Signed)
This note was copied from a baby's chart. Lactation Consultation Note  Patient Name: Hannah Maldonado [id number]'M Date: 09/06/2022 Reason for consult: Initial assessment;Early term 37-38.6wks;Hyperbilirubinemia;Infant < 6lbs Age:35 days Received consult. LC watched baby BF. Breast were engorged, hard, w/knots, baby on bili lights. He was frustrated d/t couldn't get a good latch. LC un-wrapped blanket and placed on back side of baby while BF. Suggested mom may need to pump if breast are hard w/engorgement before feeding baby so he can maybe latch easier.  Asked mom if she would like to use DEBP, mom stated yes that she has been using hand pump. The first time mom pumped she collected 40 ml and baby drank it, this last time mom pumped 20 ml.  Mom shown how to use DEBP & how to disassemble, clean, & reassemble parts. Suggested pump every 3 hrs or sooner if needed for engorgement. MD only wants mom to BF for 10 minutes on each breast then supplement w/BM. Baby is transferring BM. Encouraged mom to use support to keep baby closer to her to keep deep latch.  Mom used DEBP and thought she liked the hand pump better. So mom switched to pumping w/hand pump. Milk storage reviewed. Answered questions mom had. Praised mom for baby BF so well. Mom stated he has been BF well the whole time, that hasn't been a problem. Encouraged to call for questions or concerns. Maternal Data Has patient been taught Hand Expression?: Yes Does the patient have breastfeeding experience prior to this delivery?: Yes How long did the patient breastfeed?: 2 yrs  Feeding Nipple Type: Slow - flow  LATCH Score Latch: Grasps breast easily, tongue down, lips flanged, rhythmical sucking.  Audible Swallowing: Spontaneous and intermittent  Type of Nipple: Everted at rest and after stimulation  Comfort (Breast/Nipple): Soft / non-tender  Hold (Positioning): Assistance needed to correctly position infant at breast and  maintain latch.  LATCH Score: 9   Lactation Tools Discussed/Used Tools: Pump Breast pump type: Double-Electric Breast Pump;Manual Pump Education: Setup, frequency, and cleaning;Milk Storage Reason for Pumping: engorgement/supplementation Pumping frequency: q3hr  Interventions Interventions: DEBP;Adjust position;Breast feeding basics reviewed;Assisted with latch;Support pillows;Skin to skin;Position options;Education;Breast massage;Expressed milk;Pace feeding;Hand express;Pre-pump if needed;Breast compression  Discharge Discharge Education: Engorgement and breast care  Consult Status Consult Status: Follow-up Date: 09/07/21 Follow-up type: In-patient    Theodoro Kalata 09/06/2022, 10:26 PM

## 2022-09-07 ENCOUNTER — Ambulatory Visit: Payer: Self-pay

## 2022-09-07 NOTE — Lactation Note (Signed)
This note was copied from a baby's chart. Lactation Consultation Note  Patient Name: Hannah Maldonado KYHCW'C Date: 09/07/2022 Reason for consult: Follow-up assessment;Engorgement;Infant < 6lbs;Hyperbilirubinemia Age:37 years  Spanish interpreter offered but declined the need for the service, mother speaking English during this visit.  [place] visit to pediatrics to see mother of 25 day old infant who was readmitted for elevated bilirubin. Mother was engorged on admission and doing better now. She has been breastfeeding and post pumping with a manual pump. Last pumping was 37 ml EBM. Breast are still full and nodular. Mother states she pumped enough to feel comfortable but not empty, in order to feed baby at breast when he wakes up.   Mother attempted to use DEBP this visit but thinks it is too slow and she does not like it as well. She expressed about 6 ml in 5 minutes, however, she had just pumped manually. Ice packs given to reduce edema on outer edges of breast and instructed to apply ice packs every 2 hours for 15 minutes until breast are softening and easy milk flow. Mother was taking Ibuprofen and Tylenol at home which she plans to resume after discharge.   This is an experienced breastfeeding mother who breast fed her last child for 2 years and into the early part of this pregnancy. She reports being that she (mother) was hospitalized 2 times following her last child's birth and she was able to maintain her milk supply using a manual pump.   Reviewed engorgement management, maintaining milk  supply while baby overcomes jaundice and early term delivery and to call if she develops a fever or signs of mastitis. Aware of OP Lactation support and services.    Lactation Tools Discussed/Used Tools: Pump Breast pump type: Manual;Double-Electric Breast Pump Reason for Pumping: engorgement and supplement  Interventions Interventions: DEBP;Education  Discharge Discharge Education: Engorgement  and breast care;Warning signs for feeding baby Pump: Manual (mother prefers hand pump)  Consult Status Consult Status: Complete Date: 09/07/22    Hannah Maldonado 09/07/2022, 1:03 PM

## 2022-09-12 ENCOUNTER — Ambulatory Visit: Payer: BC Managed Care – PPO

## 2023-02-17 ENCOUNTER — Ambulatory Visit
Admission: EM | Admit: 2023-02-17 | Discharge: 2023-02-17 | Disposition: A | Discharge status: Home / Self Care | Payer: BC Managed Care – PPO | Attending: Emergency Medicine | Admitting: Emergency Medicine

## 2023-02-17 DIAGNOSIS — T148XXA Other injury of unspecified body region, initial encounter: Secondary | ICD-10-CM

## 2023-02-17 DIAGNOSIS — W503XXA Accidental bite by another person, initial encounter: Secondary | ICD-10-CM

## 2023-02-17 DIAGNOSIS — N61 Mastitis without abscess: Secondary | ICD-10-CM | POA: Diagnosis not present

## 2023-02-17 DIAGNOSIS — S21059A Open bite of unspecified breast, initial encounter: Secondary | ICD-10-CM | POA: Diagnosis not present

## 2023-02-17 MED ORDER — AMOXICILLIN-POT CLAVULANATE 875-125 MG PO TABS: 1.0000 | ORAL_TABLET | Freq: Two times a day (BID) | ORAL | 0 refills | Status: DC
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
## 2023-02-17 NOTE — Discharge Instructions (Addendum)
Take the antibiotic as directed.  Follow up with your primary care provider.    

## 2023-02-17 NOTE — ED Triage Notes (Signed)
Patient to Urgent Care with complaints of a bite present to her left nipple that occurred when breastfeeding her baby.   Occurred two days ago. Taking tylenol.

## 2023-03-09 ENCOUNTER — Emergency Department
Admission: EM | Admit: 2023-03-09 | Discharge: 2023-03-09 | Disposition: A | Discharge status: Home / Self Care | Payer: BC Managed Care – PPO | Attending: Emergency Medicine | Admitting: Emergency Medicine

## 2023-03-09 ENCOUNTER — Other Ambulatory Visit: Payer: Self-pay

## 2023-03-09 ENCOUNTER — Emergency Department: Payer: BC Managed Care – PPO

## 2023-03-09 DIAGNOSIS — K2970 Gastritis, unspecified, without bleeding: Secondary | ICD-10-CM | POA: Diagnosis not present

## 2023-03-09 DIAGNOSIS — R1010 Upper abdominal pain, unspecified: Secondary | ICD-10-CM | POA: Diagnosis present

## 2023-03-09 LAB — CBC WITH DIFFERENTIAL/PLATELET
Abs Immature Granulocytes: 0.03 10*3/uL (ref 0.00–0.07)
Basophils Absolute: 0 10*3/uL (ref 0.0–0.1)
Basophils Relative: 0 %
Eosinophils Absolute: 0 10*3/uL (ref 0.0–0.5)
Eosinophils Relative: 0 %
HCT: 41.6 % (ref 36.0–46.0)
Hemoglobin: 14.1 g/dL (ref 12.0–15.0)
Immature Granulocytes: 0 %
Lymphocytes Relative: 21 %
Lymphs Abs: 1.9 10*3/uL (ref 0.7–4.0)
MCH: 30.5 pg (ref 26.0–34.0)
MCHC: 33.9 g/dL (ref 30.0–36.0)
MCV: 89.8 fL (ref 80.0–100.0)
Monocytes Absolute: 0.6 10*3/uL (ref 0.1–1.0)
Monocytes Relative: 7 %
Neutro Abs: 6.8 10*3/uL (ref 1.7–7.7)
Neutrophils Relative %: 72 %
Platelets: 294 10*3/uL (ref 150–400)
RBC: 4.63 MIL/uL (ref 3.87–5.11)
RDW: 11.7 % (ref 11.5–15.5)
WBC: 9.4 10*3/uL (ref 4.0–10.5)
nRBC: 0 % (ref 0.0–0.2)

## 2023-03-09 LAB — COMPREHENSIVE METABOLIC PANEL
ALT: 19 U/L (ref 0–44)
AST: 21 U/L (ref 15–41)
Albumin: 4.3 g/dL (ref 3.5–5.0)
Alkaline Phosphatase: 52 U/L (ref 38–126)
Anion gap: 9 (ref 5–15)
BUN: 16 mg/dL (ref 6–20)
CO2: 21 mmol/L — ABNORMAL LOW (ref 22–32)
Calcium: 9.6 mg/dL (ref 8.9–10.3)
Chloride: 106 mmol/L (ref 98–111)
Creatinine, Ser: 0.92 mg/dL (ref 0.44–1.00)
GFR, Estimated: >60 mL/min (ref >60)
Glucose, Bld: 110 mg/dL — ABNORMAL HIGH (ref 70–99)
Potassium: 4 mmol/L (ref 3.5–5.1)
Sodium: 136 mmol/L (ref 135–145)
Total Bilirubin: 0.5 mg/dL (ref 0.3–1.2)
Total Protein: 8 g/dL (ref 6.5–8.1)

## 2023-03-09 LAB — URINALYSIS, ROUTINE W REFLEX MICROSCOPIC
Bilirubin Urine: NEGATIVE
Glucose, UA: NEGATIVE mg/dL
Hgb urine dipstick: NEGATIVE
Ketones, ur: NEGATIVE mg/dL
Nitrite: NEGATIVE
Protein, ur: NEGATIVE mg/dL
Specific Gravity, Urine: 1.015 (ref 1.005–1.030)
pH: 6 (ref 5.0–8.0)

## 2023-03-09 LAB — PREGNANCY, URINE: Preg Test, Ur: NEGATIVE

## 2023-03-09 LAB — LIPASE, BLOOD: Lipase: 48 U/L (ref 11–51)

## 2023-03-09 MED ORDER — ALUM & MAG HYDROXIDE-SIMETH 200-200-20 MG/5ML PO SUSP
30.0000 mL | Freq: Once | ORAL | Status: AC
  Administered 2023-03-09: 30 mL via ORAL
  Filled 2023-03-09: qty 30

## 2023-03-09 MED ORDER — PANTOPRAZOLE SODIUM 40 MG PO TBEC: 40.0000 mg | DELAYED_RELEASE_TABLET | Freq: Every day | ORAL | 1 refills | Status: DC

## 2023-03-09 MED ORDER — LIDOCAINE VISCOUS HCL 2 % MT SOLN
15.0000 mL | Freq: Once | OROMUCOSAL | Status: AC
  Administered 2023-03-09: 15 mL via ORAL
  Filled 2023-03-09: qty 15

## 2023-03-09 MED ORDER — SUCRALFATE 1 G PO TABS: 1.0000 g | ORAL_TABLET | Freq: Four times a day (QID) | ORAL | 0 refills | Status: DC

## 2023-03-09 MED ORDER — PANTOPRAZOLE SODIUM 40 MG PO TBEC
40.0000 mg | DELAYED_RELEASE_TABLET | Freq: Once | ORAL | Status: AC
  Administered 2023-03-09: 40 mg via ORAL
  Filled 2023-03-09: qty 1

## 2023-03-09 NOTE — ED Provider Notes (Signed)
[facility] Provider Note    Event Date/Time   First MD Initiated Contact with Patient 03/09/23 1716     (approximate)  History   Chief Complaint: Abdominal Pain  HPI  Hannah Maldonado is a 37 y.o. female with a past medical history anxiety, depression, presents to the emergency department for upper abdominal discomfort.  According to the patient for the last 5 days or so she has been experiencing intermittent upper abdominal discomfort.  Denies any nausea vomiting or diarrhea.  Patient states she is status post cholecystectomy.  Patient is currently breast-feeding and did not know what she was able to take for this discomfort.  Patient states that history of gastric reflux.  No fever.  Physical Exam   Triage Vital Signs: ED Triage Vitals  Encounter Vitals Group     BP 03/09/23 1552 116/80     Systolic BP Percentile --      Diastolic BP Percentile --      Pulse Rate 03/09/23 1552 87     Resp 03/09/23 1552 18     Temp 03/09/23 1552 98.4 F (36.9 C)     Temp Source 03/09/23 1552 Oral     SpO2 03/09/23 1552 97 %     Weight 03/09/23 1551 140 lb (63.5 kg)     Height 03/09/23 1551 5\' 2"  (1.575 m)     Head Circumference --      Peak Flow --      Pain Score 03/09/23 1551 9     Pain Loc --      Pain Education --      Exclude from Growth Chart --     Most recent vital signs: Vitals:   03/09/23 1552  BP: 116/80  Pulse: 87  Resp: 18  Temp: 98.4 F (36.9 C)  SpO2: 97%    General: Awake, no distress.  CV:  Good peripheral perfusion.  Regular rate and rhythm  Resp:  Normal effort.  Equal breath sounds bilaterally.  Abd:  No distention.  Soft, mild epigastric tenderness to palpation without rebound or guarding.  No right upper quadrant tenderness.  ED Results / Procedures / Treatments   RADIOLOGY  Ultrasound shows prior cholecystectomy otherwise unremarkable   MEDICATIONS ORDERED IN ED: Medications - No data to display   IMPRESSION /  MDM / ASSESSMENT AND PLAN / ED COURSE  I reviewed the triage vital signs and the nursing notes.  Patient's presentation is most consistent with acute presentation with potential threat to life or bodily function.  Patient presents emergency department for epigastric discomfort she describes more as a burning sensation.  Does state a history of reflux.  Patient took 1 tablet of omeprazole several days ago but states this did not help her discomfort.  Patient denies any fever denies any nausea or vomiting.  Denies any diarrhea.  Patient is currently breast-feeding and states she did not know what she was able to safely take.  Patient is lab work today shows a reassuring chemistry including normal LFTs, normal lipase, normal CBC pregnancy test is negative and urinalysis is reassuring.  We will obtain a right upper quadrant as a precaution.  As long as the patient's ultrasound is negative anticipate discharge home with treatment for gastritis including Protonix and sucralfate and have the patient follow-up with a GI doctor for further evaluation if symptoms continue.  Patient agreeable to plan of care.  Ultrasound shows prior cholecystectomy otherwise unremarkable.  Will discharge on Protonix and sucralfate have  the patient follow-up with her doctor.  FINAL CLINICAL IMPRESSION(S) / ED DIAGNOSES   Gastritis Epigastric pain  Rx / DC Orders   Protonix Sucralfate  Note:  This document was prepared using Dragon voice recognition software and may include unintentional dictation errors.   Minna Antis, MD 03/09/23 1858

## 2023-03-09 NOTE — Discharge Instructions (Addendum)
Please take your Protonix each morning for the next 2 months.  Please take your sucralfate before breakfast, lunch, dinner and before going to bed for the next 2 weeks.  Return to the emergency department for any worsening pain or fever or any other symptom concerning to yourself.  Avoid spicy foods or ibuprofen products for the next 2 weeks.

## 2023-03-09 NOTE — ED Triage Notes (Addendum)
Pt states she has had intermittent abd pain and loss of appetite since last Sunday. Pt denies n/v/d. Denies urinary symptoms.

## 2023-12-02 ENCOUNTER — Emergency Department

## 2023-12-02 ENCOUNTER — Other Ambulatory Visit: Payer: Self-pay

## 2023-12-02 ENCOUNTER — Emergency Department
Admission: EM | Admit: 2023-12-02 | Discharge: 2023-12-02 | Disposition: A | Discharge status: Home / Self Care | Attending: Emergency Medicine | Admitting: Emergency Medicine

## 2023-12-02 DIAGNOSIS — N83201 Unspecified ovarian cyst, right side: Secondary | ICD-10-CM | POA: Diagnosis not present

## 2023-12-02 DIAGNOSIS — R102 Pelvic and perineal pain: Secondary | ICD-10-CM

## 2023-12-02 DIAGNOSIS — R109 Unspecified abdominal pain: Secondary | ICD-10-CM | POA: Diagnosis present

## 2023-12-02 LAB — CBC WITH DIFFERENTIAL/PLATELET
Abs Immature Granulocytes: 0.01 10*3/uL (ref 0.00–0.07)
Basophils Absolute: 0 10*3/uL (ref 0.0–0.1)
Basophils Relative: 0 %
Eosinophils Absolute: 0.2 10*3/uL (ref 0.0–0.5)
Eosinophils Relative: 3 %
HCT: 39.8 % (ref 36.0–46.0)
Hemoglobin: 13.2 g/dL (ref 12.0–15.0)
Immature Granulocytes: 0 %
Lymphocytes Relative: 38 %
Lymphs Abs: 2.1 10*3/uL (ref 0.7–4.0)
MCH: 29.1 pg (ref 26.0–34.0)
MCHC: 33.2 g/dL (ref 30.0–36.0)
MCV: 87.9 fL (ref 80.0–100.0)
Monocytes Absolute: 0.4 10*3/uL (ref 0.1–1.0)
Monocytes Relative: 7 %
Neutro Abs: 2.8 10*3/uL (ref 1.7–7.7)
Neutrophils Relative %: 52 %
Platelets: 235 10*3/uL (ref 150–400)
RBC: 4.53 MIL/uL (ref 3.87–5.11)
RDW: 13.6 % (ref 11.5–15.5)
WBC: 5.5 10*3/uL (ref 4.0–10.5)
nRBC: 0 % (ref 0.0–0.2)

## 2023-12-02 LAB — COMPREHENSIVE METABOLIC PANEL WITH GFR
ALT: 36 U/L (ref 0–44)
AST: 32 U/L (ref 15–41)
Albumin: 4.1 g/dL (ref 3.5–5.0)
Alkaline Phosphatase: 48 U/L (ref 38–126)
Anion gap: 7 (ref 5–15)
BUN: 18 mg/dL (ref 6–20)
CO2: 22 mmol/L (ref 22–32)
Calcium: 8.8 mg/dL — ABNORMAL LOW (ref 8.9–10.3)
Chloride: 106 mmol/L (ref 98–111)
Creatinine, Ser: 0.72 mg/dL (ref 0.44–1.00)
GFR, Estimated: >60 mL/min (ref >60)
Glucose, Bld: 92 mg/dL (ref 70–99)
Potassium: 3.9 mmol/L (ref 3.5–5.1)
Sodium: 135 mmol/L (ref 135–145)
Total Bilirubin: 0.6 mg/dL (ref 0.0–1.2)
Total Protein: 7.6 g/dL (ref 6.5–8.1)

## 2023-12-02 LAB — URINALYSIS, ROUTINE W REFLEX MICROSCOPIC
Bilirubin Urine: NEGATIVE
Glucose, UA: NEGATIVE mg/dL
Hgb urine dipstick: NEGATIVE
Ketones, ur: NEGATIVE mg/dL
Leukocytes,Ua: NEGATIVE
Nitrite: NEGATIVE
Protein, ur: NEGATIVE mg/dL
Specific Gravity, Urine: 1.019 (ref 1.005–1.030)
pH: 5 (ref 5.0–8.0)

## 2023-12-02 LAB — POC URINE PREG, ED: Preg Test, Ur: NEGATIVE

## 2023-12-02 LAB — HCG, QUANTITATIVE, PREGNANCY: hCG, Beta Chain, Quant, S: <1 m[IU]/mL (ref <5)

## 2023-12-02 LAB — LIPASE, BLOOD: Lipase: 40 U/L (ref 11–51)

## 2023-12-02 MED ORDER — IOHEXOL 300 MG/ML  SOLN
100.0000 mL | Freq: Once | INTRAMUSCULAR | Status: AC | PRN
  Administered 2023-12-02: 100 mL via INTRAVENOUS

## 2023-12-02 NOTE — ED Triage Notes (Signed)
 Pt to ED via POV from home. Pt ambulatory to triage. Pt reports umbilical level pain that started 2 days ago. Pt reports yesterday belly button appeared purple. No N/V/D

## 2023-12-02 NOTE — ED Notes (Signed)
 EDP at [facility]

## 2023-12-02 NOTE — ED Notes (Signed)
 No changes, pending results, NAD, calm, scrolling on phone.

## 2023-12-02 NOTE — ED Notes (Addendum)
 Unable to void at this time. CBIR, VSS. Alert, NAD, calm, interactive, resps e/u, speaking in clear complete sentences, skin W&D.   Pinpoints pain to umbilicus. Soft NT. No discoloration, heat, induration, localized focal collection, hardness, swelling, or drainage. Denies NVD or fever.

## 2023-12-02 NOTE — ED Notes (Signed)
 To CT by w/c, alert, NAD, calm, interactive.

## 2023-12-02 NOTE — ED Notes (Signed)
 Back from CT

## 2023-12-02 NOTE — ED Notes (Signed)
 EDP and interpreter finished at [facility], up from room commode, now to US  by w/c, no changes. Alert, NAD, calm, interactive, no changes.

## 2023-12-02 NOTE — ED Provider Notes (Signed)
 [facility] Provider Note    Event Date/Time   First MD Initiated Contact with Patient 12/02/23 0915     (approximate)   History   Abdominal Pain   HPI  Hannah Maldonado is a 38 y.o. female who presents to the ED for evaluation of Abdominal Pain   Review ED visit from last summer.  Seen for epigastric pain, thought to be gastritis.  History of cholecystectomy.  Tubal ligation 2024.  Patient presents for evaluation of 2 episodes of abdominal pain in the past 24 hours.  1 episode yesterday afternoon, periumbilical, lasting 5 to 10 minutes and self resolving.  Another episode this morning, now directed towards the RLQ, improving on its own but still persists.  Reports 3/10 RLQ pain right now.  No nausea, emesis, stool or urinary changes.   Physical Exam   Triage Vital Signs: ED Triage Vitals  Encounter Vitals Group     BP 12/02/23 0913 120/88     Systolic BP Percentile --      Diastolic BP Percentile --      Pulse Rate 12/02/23 0913 76     Resp 12/02/23 0913 20     Temp 12/02/23 0913 98 F (36.7 C)     Temp Source 12/02/23 0913 Oral     SpO2 12/02/23 0913 98 %     Weight --      Height --      Head Circumference --      Peak Flow --      Pain Score 12/02/23 0912 8     Pain Loc --      Pain Education --      Exclude from Growth Chart --     Most recent vital signs: Vitals:   12/02/23 0950 12/02/23 1000  BP: 111/77 126/78  Pulse: 68 77  Resp:    Temp:    SpO2: 100% 100%    General: Awake, no distress.  CV:  Good peripheral perfusion.  Resp:  Normal effort.  Abd:  No distention.  Mild RLQ tenderness without guarding or peritoneal features.  Otherwise benign. MSK:  No deformity noted.  Neuro:  No focal deficits appreciated. Other:     ED Results / Procedures / Treatments   Labs (all labs ordered are listed, but only abnormal results are displayed) Labs Reviewed  COMPREHENSIVE METABOLIC PANEL WITH GFR - Abnormal; Notable  for the following components:      Result Value   Calcium 8.8 (*)    All other components within normal limits  URINALYSIS, ROUTINE W REFLEX MICROSCOPIC - Abnormal; Notable for the following components:   Color, Urine YELLOW (*)    APPearance HAZY (*)    All other components within normal limits  CBC WITH DIFFERENTIAL/PLATELET  LIPASE, BLOOD  HCG, QUANTITATIVE, PREGNANCY  POC URINE PREG, ED    EKG   RADIOLOGY CT abdomen/pelvis interpreted by me with large cyst to the right adnexa Pelvic ultrasound interpreted by me with 2 right adnexal cyst without signs of torsion  Official radiology report(s): US  PELVIC TRANSABD W/PELVIC DOPPLER Result Date: 12/02/2023 CLINICAL DATA:  Adnexal cyst on CT scan earlier today. EXAM: TRANSABDOMINAL AND TRANSVAGINAL ULTRASOUND OF PELVIS DOPPLER ULTRASOUND OF OVARIES TECHNIQUE: Both transabdominal and transvaginal ultrasound examinations of the pelvis were performed. Transabdominal technique was performed for global imaging of the pelvis including uterus, ovaries, adnexal regions, and pelvic cul-de-sac. Color and duplex Doppler ultrasound was utilized to evaluate blood flow to the ovaries. It was  necessary to proceed with endovaginal exam following the transabdominal exam to visualize the ovaries. COMPARISON:  CT scan from earlier same day. Pelvis ultrasound 03/18/2020 FINDINGS: Uterus Measurements: 8.3 x 4.1 x 5.6 cm = volume: 98.2 mL. No fibroids or other mass visualized. Endometrium Thickness: 11 mm.  No focal abnormality visualized. Right ovary Measurements: 3.2 x 1.9 x 2.7 cm = volume: 8.5 mL. Paraovarian right adnexal cyst has simple imaging features and measures 6.6 x 4.4 x 4.6 cm. Immediately adjacent simple appearing cyst measures 3.9 x 1.9 x 2.8 cm. Patient had 2 cystic lesions in the right adnexal space distinct from the ovary on the previous exam from 2021 measuring 5.9 x 3.4 x 5.4 cm and 3.5 x 2.9 x 2.4 cm, respectively. Left ovary Measurements: 3.4 x  1.5 x 2.0 cm = volume: 5.3 mL. Normal appearance/no adnexal mass. Pulsed Doppler evaluation of both ovaries demonstrates normal low-resistance arterial and venous waveforms. Other findings No abnormal free fluid. IMPRESSION: 1. Two simple appearing cysts in the right adnexal space, distinct from the right ovary, measure 6.6 x 4.4 x 4.6 cm and 3.9 x 1.9 x 2.8 cm, respectively. Patient had 2 cystic lesions in the right adnexal space distinct from the ovary on the previous exam from 2021 measuring 5.9 x 3.4 x 5.4 cm and 3.5 x 2.9 x 2.4 cm, respectively. If these represent the same cyst, the interval stability would be most suggestive of a benign etiology, but without intervening studies, there is a possibility that these represent new adnexal cysts. Given the history of right lower quadrant pain, follow-up ultrasound exam in 612 weeks recommended to re-evaluate. 2. No free fluid. Electronically Signed   By: Donnal Fusi M.D.   On: 12/02/2023 12:41   CT ABDOMEN PELVIS W CONTRAST Result Date: 12/02/2023 CLINICAL DATA:  Periumbilical and right lower quadrant pain, evaluate for appendicitis. EXAM: CT ABDOMEN AND PELVIS WITH CONTRAST TECHNIQUE: Multidetector CT imaging of the abdomen and pelvis was performed using the standard protocol following bolus administration of intravenous contrast. RADIATION DOSE REDUCTION: This exam was performed according to the departmental dose-optimization program which includes automated exposure control, adjustment of the mA and/or kV according to patient size and/or use of iterative reconstruction technique. CONTRAST:  100mL OMNIPAQUE IOHEXOL 300 MG/ML  SOLN COMPARISON:  Pelvic ultrasound 03/18/2020 FINDINGS: Lower chest:  No contributory findings. Hepatobiliary: No focal liver abnormality.No evidence of biliary obstruction or stone. Pancreas: Unremarkable. Spleen: Unremarkable. Adrenals/Urinary Tract: Negative adrenals. No hydronephrosis or stone. Unremarkable bladder. Stomach/Bowel:   No obstruction. No appendicitis. Vascular/Lymphatic: No acute vascular abnormality. No mass or adenopathy. Reproductive:2 adnexal cyst on the right, smaller and more anterior measuring 2.9 cm and a more posterior and inferior measuring 7.4 cm craniocaudal. No internal complexity is seen at either cyst, there is a corpus luteum appearance within the right ovarian parenchyma. No ovarian parenchymal edematous appearance or vascular pedicle thickening. Other: No ascites or pneumoperitoneum. Tiny fatty umbilical hernia. Trace pelvic fluid, usually physiologic. Musculoskeletal: No acute abnormalities. IMPRESSION: No acute finding.  No appendicitis. Right adnexal cysts, the larger measuring 7.4 cm which is increased from a 2021 ultrasound (5.9 cm). Recommend gynecology follow-up. Electronically Signed   By: Ronnette Coke M.D.   On: 12/02/2023 11:11    PROCEDURES and INTERVENTIONS:  Procedures  Medications  iohexol (OMNIPAQUE) 300 MG/ML solution 100 mL (100 mLs Intravenous Contrast Given 12/02/23 1023)     IMPRESSION / MDM / ASSESSMENT AND PLAN / ED COURSE  I reviewed the triage vital signs  and the nursing notes.  Differential diagnosis includes, but is not limited to, appendicitis, incisional hernia, UTI, IBS, gastroenteritis  {Patient presents with symptoms of an acute illness or injury that is potentially life-threatening.  Patient presents with 2 episodes of sharp right lower quadrant/right pelvic pain possibly related to 2 large ovarian cyst on the right but without signs of torsion or complicating features, suitable for outpatient gynecologic follow-up.  Reassuring vital signs and blood work with a normal CBC, metabolic panel, lipase and UA.  CT, as above rules out appendicitis but demonstrates large cyst on the right ovary, follow-up ultrasound without signs of torsion.  Her pain is controlled and she is suitable for outpatient follow-up.  Clinical Course as of 12/02/23 1329  Sun Dec 02, 2023   1135 Reassessed with our in person interpreter at patient's request to provide an update and discussed my recommendation for an ultrasound.  She is agreeable [DS]  1328 Reassessed and discussed ultrasound results, gynecologic follow-up and ED return precautions.  Answered questions [DS]    Clinical Course User Index [DS] Arline Bennett, MD     FINAL CLINICAL IMPRESSION(S) / ED DIAGNOSES   Final diagnoses:  Pelvic pain  Cyst of right ovary     Rx / DC Orders   ED Discharge Orders     None        Note:  This document was prepared using Dragon voice recognition software and may include unintentional dictation errors.   Arline Bennett, MD 12/02/23 1329

## 2023-12-02 NOTE — ED Notes (Signed)
 Back from [place].

## 2023-12-02 NOTE — Discharge Instructions (Addendum)
Please take Tylenol and ibuprofen/Advil for your pain.  It is safe to take them together, or to alternate them every few hours.  Take up to 1000mg of Tylenol at a time, up to 4 times per day.  Do not take more than 4000 mg of Tylenol in 24 hours.  For ibuprofen, take 400-600 mg, 3 - 4 times per day.  

## 2024-01-02 ENCOUNTER — Other Ambulatory Visit: Payer: Self-pay | Admitting: Obstetrics and Gynecology

## 2024-01-02 DIAGNOSIS — Z01818 Encounter for other preprocedural examination: Secondary | ICD-10-CM

## 2024-01-02 NOTE — H&P (View-Only) (Signed)
 GYN H&P   CC: pre-op   HPI: Hannah Maldonado 38 y.o. [phone] with a hx of Graves disease s/p thyroidectomy now with surgical hypothyroidism, BMI 26, right adnexal masses, and A2GDM who presents for ED follow-up.    Seen in the ED on 12/02/23 for abdominal pain. Vitals notable for mild hypertension 120/88 but otherwise wnl. B hcg neg. UA wnl. CBC, CMP, and lipase wnl. CT A/P with IV contrast showed 2 right adnexal cysts- measuring 2.9 and 7.4 cm. Pelvic ultrasound: uterus 8.3x4.1x5.6cm, R paraovarian cyst measuring 6.6x4.4x4.6cm and second simple cyst measuring 3.9x1.9x2.8cm, L ovary wnl.   Reports intermittent pain for past 3 months maybe about 3 times per week. Lasts for a few minutes. Lower abdominal cramping. Also has pain sometimes in belly button. Reports nausea when having the pain. Denies CP, SOB, abnormal vaginal discharge.    [facility]: [facility]  ROS: All other systems reviewed and negative   PMHx: Past Medical History:  Diagnosis Date   Thyroid disease       PSHx: Past Surgical History:  Procedure Laterality Date   THYROIDECTOMY TOTAL  2018   CHOLECYSTECTOMY  03/23/2020   Dr Truman Gables -- ROBOTIC   THYROIDECTOMY     TUBAL LIGATION       OBHx: OB History  Gravida Para Term Preterm AB Living  2 2 1 1   2   SAB IAB Ectopic Molar Multiple Live Births            2    # Outcome Date GA Lbr Len/2nd Weight Sex Type Anes PTL Lv  2 Preterm 08/31/22 [redacted]w[redacted]d  2.58 kg (5 lb 11 oz)  Vag-Spont  Y LIV  1 Term 02/27/20 [redacted]w[redacted]d / 00:57 3.27 kg (7 lb 3.3 oz) F Vag-Spont EPI  LIV     GYN Hx: - LMP: Patient's last menstrual period was 12/13/2023 (exact date).  - Menarche: Age 82 - Menses: Reports bleeding for 3-4 days every month (regular intervals), not heavy, denies significant associated cramping.  - Pap hx: Denies any history of abnormals, last 2023 normal per report. Never had any excisional procedures - STI hx: Denies any history of STIs -  Sexual preference: Sexually active with husband - Dyspareunia or sexual concerns: sometimes - Contraceptive method: s/p BTL - Fertility desires: satisfied parity - HPV vaccination: has not received - Abdominal surgeries: BTL (PP partial salpingectomy with Ligasure), chole - GYN procedures: none  FHx: Denies FHx of ovarian, breast,  cervical, and colon cancer Uterine cancer- maternal grandmother   Meds: Current Outpatient Medications on File Prior to Visit  Medication Sig Dispense Refill   acetaminophen  (TYLENOL ) 500 MG tablet Take 1,000 mg by mouth every 6 (six) hours as needed for Pain     ibuprofen  (MOTRIN ) 600 MG tablet Take 600 mg by mouth every 6 (six) hours as needed     levothyroxine  (SYNTHROID ) 137 MCG tablet      No current facility-administered medications on file prior to visit.     Allergies: Allergies  Allergen Reactions   Thiethylperazine Muscle Pain    Muscle spasms     SocHx: Social History   Tobacco Use   Smoking status: Never   Smokeless tobacco: Never  Vaping Use   Vaping status: Never Used  Substance Use Topics   Alcohol use: Not Currently   Drug use: Never     OBJECTIVE: BP 108/74 (BP Location: Left upper arm, Patient Position: Sitting)   Pulse 73   Ht 157.5  cm (5\' 2" )   Wt 65.3 kg (144 lb)   LMP 12/13/2023 (Exact Date)   BMI 26.34 kg/m    Gen: NAD HEENT: [place]/AT Heart: Regular rate Lungs: Normal work of breathing Abd: BS present, soft, tender RLQ, nondistended, no rebound or guarding Ext: No BLE edema Pelvic exam: Abel Abelson, LPN present as chaperone Normal external female genitalia without lesions;  Normal bartholins/skenes glands, normal urethral meatus;  Vaginal mucosa pink and moist with normal rugae, no discharge or blood in the vault;  Cervix is normal without lesions, multiparous in appearance Bimanual exam:     6 wk size uterus, normal shape and contour;      R adnexal tenderness, no masses palpable   Chaperone present  for pelvic exam.  ASSESSMENT/PLAN: Hannah Maldonado 38 y.o. [phone] with a hx of Graves disease s/p thyroidectomy now with surgical hypothyroidism, BMI 26, right adnexal masses, and A2GDM who presents for ED follow-up.   #Right adnexal masses - Imaging Pelvic ultrasound 03/18/20: 2 paraovarian cysts measuring 5.9x3.4x5.4 and 3.5x2.9x2.4 CT A/P with IV contrast 12/02/23: 2 right adnexal cysts- measuring 2.9 and 7.4 cm.  Pelvic ultrasound 12/02/23: 2 paraovarian cysts measuring 6.6x4.4x4.6cm and 3.9x1.9x2.8cm  - Discussed recommendation for surgical removal given the masses have persisted and due to the size and risk of intermittent torsion. - Case request submitted for diagnostic laparoscopy, removal of pelvic mass, and possible salpingo-oophorectomy. Discussed possible need for unilateral oophorectomy. Plan for surgery within next 2 weeks.  - Discussed planned procedure, risks, and benefits. Risks such as infection, bleeding, organ damage (including damage to ureters, bowel, and bladder), anesthesia complications, need to convert to laparotomy, and need for possible blood products were discussed; patient will accept products in case of an emergency. We discussed the risks of a blood transfusion, such as a 1 in 1.2-1.4 million chance of contracting HIV, Hep C.  - Plan for ERAS protocol and same day discharge  - Pre-op orders placed. No antibiotics indicated - Discussed post-op lifting restrictions and pelvic rest x 4 weeks - Post-op prescriptions (scheduled tylenol  q8h, ibuprofen  q8h, oxycodone  q6h PRN x 10 tabs, senna x 7d, miralax x 7d, zofran  PRN) sent  - ED precautions for torsion discussed  #Health maintenance: - STI screening: GC/CT/trich added to pap - Cervical cancer screening: pap collected today - HPV vaccines: Recommended and desires- will bring back for #1, #2 due in June 2025, and #3 due November 2025    RTC for post op visit  Matas Burrows Jimmy Moulding, MD

## 2024-01-02 NOTE — Progress Notes (Addendum)
 GYN Clinic Note   CC: ED follow-up adnexal mass   HPI: Hannah Maldonado 38 y.o. [phone] with a hx of Graves disease s/p thyroidectomy now with surgical hypothyroidism, BMI 26, right adnexal masses, and A2GDM who presents for ED follow-up.   Last GYN visit: 10/13/22 Hannah Maldonado, CNM for postpartum  Seen in the ED on 12/02/23 for abdominal pain. Vitals notable for mild hypertension 120/88 but otherwise wnl. B hcg neg. UA wnl. CBC, CMP, and lipase wnl. CT A/P with IV contrast showed 2 right adnexal cysts- measuring 2.9 and 7.4 cm. Pelvic ultrasound: uterus 8.3x4.1x5.6cm, R paraovarian cyst measuring 6.6x4.4x4.6cm and second simple cyst measuring 3.9x1.9x2.8cm, L ovary wnl.   Reports intermittent pain for past 3 months maybe about 3 times per week. Lasts for a few minutes. Lower abdominal cramping. Also has pain sometimes in belly button. Reports nausea when having the pain. Denies CP, SOB, abnormal vaginal discharge.    PCP: Hannah Maldonado  ROS: All other systems reviewed and negative   PMHx: Past Medical History:  Diagnosis Date  . Thyroid disease       PSHx: Past Surgical History:  Procedure Laterality Date  . THYROIDECTOMY TOTAL  2018  . CHOLECYSTECTOMY  03/23/2020   Dr Hannah Maldonado -- ROBOTIC  . THYROIDECTOMY    . TUBAL LIGATION       OBHx: OB History  Gravida Para Term Preterm AB Living  2 2 1 1   2   SAB IAB Ectopic Molar Multiple Live Births            2    # Outcome Date GA Lbr Len/2nd Weight Sex Type Anes PTL Lv  2 Preterm 08/31/22 [redacted]w[redacted]d  2.58 kg (5 lb 11 oz)  Vag-Spont  Y LIV  1 Term 02/27/20 [redacted]w[redacted]d / 00:57 3.27 kg (7 lb 3.3 oz) F Vag-Spont EPI  LIV     GYN Hx: - LMP: Patient's last menstrual period was 12/13/2023 (exact date).  - Menarche: Age 89 - Menses: Reports bleeding for 3-4 days every month (regular intervals), not heavy, denies significant associated cramping.  - Pap hx: Denies any history of abnormals, last 2023 normal  per report. Never had any excisional procedures - STI hx: Denies any history of STIs - Sexual preference: Sexually active with husband - Dyspareunia or sexual concerns: sometimes - Contraceptive method: s/p BTL - Fertility desires: satisfied parity - HPV vaccination: received - Abdominal surgeries: BTL (PP partial salpingectomy with Ligasure), chole - GYN procedures: none  FHx: Denies FHx of ovarian, breast,  cervical, and colon cancer Uterine cancer- maternal grandmother   Meds: Current Outpatient Medications on File Prior to Visit  Medication Sig Dispense Refill  . acetaminophen  (TYLENOL ) 500 MG tablet Take 1,000 mg by mouth every 6 (six) hours as needed for Pain    . ibuprofen  (MOTRIN ) 600 MG tablet Take 600 mg by mouth every 6 (six) hours as needed    . levothyroxine  (SYNTHROID ) 137 MCG tablet      No current facility-administered medications on file prior to visit.     Allergies: Allergies  Allergen Reactions  . Thiethylperazine Muscle Pain    Muscle spasms     SocHx: Social History   Tobacco Use  . Smoking status: Never  . Smokeless tobacco: Never  Vaping Use  . Vaping status: Never Used  Substance Use Topics  . Alcohol use: Not Currently  . Drug use: Never     OBJECTIVE: BP 108/74 (BP Location: Left upper arm,  Patient Position: Sitting)   Pulse 73   Ht 157.5 cm (5' 2)   Wt 65.3 kg (144 lb)   LMP 12/13/2023 (Exact Date)   BMI 26.34 kg/m    Gen: NAD HEENT: [place]/AT Heart: Regular rate Lungs: Normal work of breathing Abd: BS present, soft, tender RLQ, nondistended, no rebound or guarding Ext: No BLE edema Pelvic exam: Hannah Burkes, LPN present as chaperone Normal external female genitalia without lesions;  Normal bartholins/skenes glands, normal urethral meatus;  Vaginal mucosa pink and moist with normal rugae, no discharge or blood in the vault;  Cervix is normal without lesions, multiparous in appearance Bimanual exam:     6 wk size uterus, normal  shape and contour;      R adnexal tenderness, no masses palpable   Chaperone present for pelvic exam.  ASSESSMENT/PLAN: Hannah Maldonado 38 y.o. [phone] with a hx of Graves disease s/p thyroidectomy now with surgical hypothyroidism, BMI 26, right adnexal masses, and A2GDM who presents for ED follow-up.   #Right adnexal masses - Imaging Pelvic ultrasound 03/18/20: 2 paraovarian cysts measuring 5.9x3.4x5.4 and 3.5x2.9x2.4 CT A/P with IV contrast 12/02/23: 2 right adnexal cysts- measuring 2.9 and 7.4 cm.  Pelvic ultrasound 12/02/23: 2 paraovarian cysts measuring 6.6x4.4x4.6cm and 3.9x1.9x2.8cm  - Discussed recommendation for surgical removal given the masses have persisted and due to the size and risk of intermittent torsion. - Case request submitted for diagnostic laparoscopy, removal of pelvic mass, and possible salpingo-oophorectomy. Discussed possible need for unilateral oophorectomy. Plan for surgery within next 2 weeks.  - Discussed planned procedure, risks, and benefits. Risks such as infection, bleeding, organ damage (including damage to ureters, bowel, and bladder), anesthesia complications, need to convert to laparotomy, and need for possible blood products were discussed; patient will accept products in case of an emergency. We discussed the risks of a blood transfusion, such as a 1 in 1.2-1.4 million chance of contracting HIV, Hep C.  - Plan for ERAS protocol and same day discharge  - Pre-op orders placed. No antibiotics indicated - Discussed post-op lifting restrictions and pelvic rest x 4 weeks - Post-op prescriptions (scheduled tylenol  q8h, ibuprofen  q8h, oxycodone  q6h PRN x 10 tabs, senna x 7d, miralax  x 7d, zofran  PRN) sent  - ED precautions for torsion discussed  #Health maintenance: - STI screening: GC/CT/trich added to pap - Cervical cancer screening: pap collected today - HPV vaccines: received    RTC for post op visit  Hannah VAPORIS SCHERMERHORN, MD  Total time  on patient counter: 75 minutes. More than 50% of this time was spent doing counseling, review of medical records, review of lab tests, treatment options and follow-up plans.

## 2024-01-02 NOTE — H&P (Signed)
 GYN H&P   CC: pre-op   HPI: Hannah Maldonado 38 y.o. [phone] with a hx of Graves disease s/p thyroidectomy now with surgical hypothyroidism, BMI 26, right adnexal masses, and A2GDM who presents for ED follow-up.    Seen in the ED on 12/02/23 for abdominal pain. Vitals notable for mild hypertension 120/88 but otherwise wnl. B hcg neg. UA wnl. CBC, CMP, and lipase wnl. CT A/P with IV contrast showed 2 right adnexal cysts- measuring 2.9 and 7.4 cm. Pelvic ultrasound: uterus 8.3x4.1x5.6cm, R paraovarian cyst measuring 6.6x4.4x4.6cm and second simple cyst measuring 3.9x1.9x2.8cm, L ovary wnl.   Reports intermittent pain for past 3 months maybe about 3 times per week. Lasts for a few minutes. Lower abdominal cramping. Also has pain sometimes in belly button. Reports nausea when having the pain. Denies CP, SOB, abnormal vaginal discharge.    [facility]  ROS: All other systems reviewed and negative   PMHx: Past Medical History:  Diagnosis Date   Thyroid disease       PSHx: Past Surgical History:  Procedure Laterality Date   THYROIDECTOMY TOTAL  2018   CHOLECYSTECTOMY  03/23/2020   Dr Truman Gables -- ROBOTIC   THYROIDECTOMY     TUBAL LIGATION       OBHx: OB History  Gravida Para Term Preterm AB Living  2 2 1 1   2   SAB IAB Ectopic Molar Multiple Live Births            2    # Outcome Date GA Lbr Len/2nd Weight Sex Type Anes PTL Lv  2 Preterm 08/31/22 [redacted]w[redacted]d  2.58 kg (5 lb 11 oz)  Vag-Spont  Y LIV  1 Term 02/27/20 [redacted]w[redacted]d / 00:57 3.27 kg (7 lb 3.3 oz) F Vag-Spont EPI  LIV     GYN Hx: - LMP: Patient's last menstrual period was 12/13/2023 (exact date).  - Menarche: Age 82 - Menses: Reports bleeding for 3-4 days every month (regular intervals), not heavy, denies significant associated cramping.  - Pap hx: Denies any history of abnormals, last 2023 normal per report. Never had any excisional procedures - STI hx: Denies any history of STIs -  Sexual preference: Sexually active with husband - Dyspareunia or sexual concerns: sometimes - Contraceptive method: s/p BTL - Fertility desires: satisfied parity - HPV vaccination: has not received - Abdominal surgeries: BTL (PP partial salpingectomy with Ligasure), chole - GYN procedures: none  FHx: Denies FHx of ovarian, breast,  cervical, and colon cancer Uterine cancer- maternal grandmother   Meds: Current Outpatient Medications on File Prior to Visit  Medication Sig Dispense Refill   acetaminophen  (TYLENOL ) 500 MG tablet Take 1,000 mg by mouth every 6 (six) hours as needed for Pain     ibuprofen  (MOTRIN ) 600 MG tablet Take 600 mg by mouth every 6 (six) hours as needed     levothyroxine  (SYNTHROID ) 137 MCG tablet      No current facility-administered medications on file prior to visit.     Allergies: Allergies  Allergen Reactions   Thiethylperazine Muscle Pain    Muscle spasms     SocHx: Social History   Tobacco Use   Smoking status: Never   Smokeless tobacco: Never  Vaping Use   Vaping status: Never Used  Substance Use Topics   Alcohol use: Not Currently   Drug use: Never     OBJECTIVE: BP 108/74 (BP Location: Left upper arm, Patient Position: Sitting)   Pulse 73   Ht 157.5  cm (5\' 2" )   Wt 65.3 kg (144 lb)   LMP 12/13/2023 (Exact Date)   BMI 26.34 kg/m    Gen: NAD HEENT: [place]/AT Heart: Regular rate Lungs: Normal work of breathing Abd: BS present, soft, tender RLQ, nondistended, no rebound or guarding Ext: No BLE edema Pelvic exam: Abel Abelson, LPN present as chaperone Normal external female genitalia without lesions;  Normal bartholins/skenes glands, normal urethral meatus;  Vaginal mucosa pink and moist with normal rugae, no discharge or blood in the vault;  Cervix is normal without lesions, multiparous in appearance Bimanual exam:     6 wk size uterus, normal shape and contour;      R adnexal tenderness, no masses palpable   Chaperone present  for pelvic exam.  ASSESSMENT/PLAN: Hannah Maldonado 38 y.o. [phone] with a hx of Graves disease s/p thyroidectomy now with surgical hypothyroidism, BMI 26, right adnexal masses, and A2GDM who presents for ED follow-up.   #Right adnexal masses - Imaging Pelvic ultrasound 03/18/20: 2 paraovarian cysts measuring 5.9x3.4x5.4 and 3.5x2.9x2.4 CT A/P with IV contrast 12/02/23: 2 right adnexal cysts- measuring 2.9 and 7.4 cm.  Pelvic ultrasound 12/02/23: 2 paraovarian cysts measuring 6.6x4.4x4.6cm and 3.9x1.9x2.8cm  - Discussed recommendation for surgical removal given the masses have persisted and due to the size and risk of intermittent torsion. - Case request submitted for diagnostic laparoscopy, removal of pelvic mass, and possible salpingo-oophorectomy. Discussed possible need for unilateral oophorectomy. Plan for surgery within next 2 weeks.  - Discussed planned procedure, risks, and benefits. Risks such as infection, bleeding, organ damage (including damage to ureters, bowel, and bladder), anesthesia complications, need to convert to laparotomy, and need for possible blood products were discussed; patient will accept products in case of an emergency. We discussed the risks of a blood transfusion, such as a 1 in 1.2-1.4 million chance of contracting HIV, Hep C.  - Plan for ERAS protocol and same day discharge  - Pre-op orders placed. No antibiotics indicated - Discussed post-op lifting restrictions and pelvic rest x 4 weeks - Post-op prescriptions (scheduled tylenol  q8h, ibuprofen  q8h, oxycodone  q6h PRN x 10 tabs, senna x 7d, miralax x 7d, zofran  PRN) sent  - ED precautions for torsion discussed  #Health maintenance: - STI screening: GC/CT/trich added to pap - Cervical cancer screening: pap collected today - HPV vaccines: Recommended and desires- will bring back for #1, #2 due in June 2025, and #3 due November 2025    RTC for post op visit  Matas Burrows Jimmy Moulding, MD

## 2024-01-10 ENCOUNTER — Encounter
Admission: RE | Admit: 2024-01-10 | Discharge: 2024-01-10 | Disposition: A | Discharge status: Home / Self Care | Source: Ambulatory Visit | Attending: Obstetrics and Gynecology | Admitting: Obstetrics and Gynecology

## 2024-01-10 ENCOUNTER — Other Ambulatory Visit: Payer: Self-pay

## 2024-01-10 VITALS — Ht 62.0 in | Wt 144.0 lb

## 2024-01-10 DIAGNOSIS — Z01818 Encounter for other preprocedural examination: Secondary | ICD-10-CM

## 2024-01-10 HISTORY — DX: Gestational diabetes mellitus in pregnancy, unspecified control: O24.419

## 2024-01-10 HISTORY — DX: Other specified conditions associated with female genital organs and menstrual cycle: N94.89

## 2024-01-10 NOTE — Progress Notes (Signed)
 Anesthesia interview done with language line interpreter Ethelle Herb [phone]

## 2024-01-10 NOTE — Patient Instructions (Addendum)
 Your procedure is scheduled on:01-17-24 Thursday Report to the Registration Desk on the 1st floor of the [facility].Then proceed to the 2nd floor Surgery Desk To find out your arrival time, please call [phone] between 1PM - 3PM on:01-16-24 Wednesday If your arrival time is 6:00 am, do not arrive before that time as the [facility] entrance doors do not open until 6:00 am.  REMEMBER: Instructions that are not followed completely may result in serious medical risk, up to and including death; or upon the discretion of your surgeon and anesthesiologist your surgery may need to be rescheduled.  Do not eat food after midnight the night before surgery.  No gum chewing or hard candies.  You may however, drink CLEAR liquids up to 2 hours before you are scheduled to arrive for your surgery. Do not drink anything within 2 hours of your scheduled arrival time.  Clear liquids include: - water  - apple juice without pulp - gatorade (not RED colors) - black coffee or tea (Do NOT add milk or creamers to the coffee or tea) Do NOT drink anything that is not on this list.  In addition, your doctor has ordered for you to drink the provided:  Ensure Pre-Surgery Clear Carbohydrate Drink  Drinking this carbohydrate drink up to two hours before surgery helps to reduce insulin resistance and improve patient outcomes. Please complete drinking 2 hours before scheduled arrival time.  One week prior to surgery:Stop NOW (01-10-24) Stop Anti-inflammatories (NSAIDS) such as Advil , Aleve, Ibuprofen , Motrin , Naproxen, Naprosyn and Aspirin based products such as Excedrin, Goody's Powder, BC Powder. Stop ANY OVER THE COUNTER supplements until after surgery.  You may however, continue to take Tylenol  if needed for pain up until the day of surgery.  Continue taking all of your other prescription medications up until the day of surgery.  ON THE DAY OF SURGERY ONLY TAKE THESE MEDICATIONS WITH SIPS OF  WATER: -levothyroxine  (SYNTHROID )   No Alcohol for 24 hours before or after surgery.  No Smoking including e-cigarettes for 24 hours before surgery.  No chewable tobacco products for at least 6 hours before surgery.  No nicotine patches on the day of surgery.  Do not use any "recreational" drugs for at least a week (preferably 2 weeks) before your surgery.  Please be advised that the combination of cocaine and anesthesia may have negative outcomes, up to and including death. If you test positive for cocaine, your surgery will be cancelled.  On the morning of surgery brush your teeth with toothpaste and water, you may rinse your mouth with mouthwash if you wish. Do not swallow any toothpaste or mouthwash.  Use CHG Soap as directed on instruction sheet.  Do not wear jewelry, make-up, hairpins, clips or nail polish.  For welded (permanent) jewelry: bracelets, anklets, waist bands, etc.  Please have this removed prior to surgery.  If it is not removed, there is a chance that hospital personnel will need to cut it off on the day of surgery.  Do not wear lotions, powders, or perfumes.   Do not shave body hair from the neck down 48 hours before surgery.  Contact lenses, hearing aids and dentures may not be worn into surgery.  Do not bring valuables to the hospital. [facility] is not responsible for any missing/lost belongings or valuables.   Notify your doctor if there is any change in your medical condition (cold, fever, infection).  Wear comfortable clothing (specific to your surgery type) to the hospital.  After surgery, you can help prevent lung complications by doing breathing exercises.  Take deep breaths and cough every 1-2 hours. Your doctor may order a device called an Incentive Spirometer to help you take deep breaths. When coughing or sneezing, hold a pillow firmly against your incision with both hands. This is called "splinting." Doing this helps protect your incision. It  also decreases belly discomfort.  If you are being admitted to the hospital overnight, leave your suitcase in the car. After surgery it may be brought to your room.  In case of increased patient census, it may be necessary for you, the patient, to continue your postoperative care in the Same Day Surgery department.  If you are being discharged the day of surgery, you will not be allowed to drive home. You will need a responsible individual to drive you home and stay with you for 24 hours after surgery.   If you are taking public transportation, you will need to have a responsible individual with you.  Please call the Pre-admissions Testing Dept. at [phone] if you have any questions about these instructions.  Surgery Visitation Policy:  Patients having surgery or a procedure may have two visitors.  Children under the age of 72 must have an adult with them who is not the patient.   Nuestro procedimiento est programado para el jueves 29/12/2023. Presntese en el mostrador de registro, ubicado en Information systems manager del [employer] Luego, dirjase al [employer] de [place], ubicado en el [employer]. Para conocer su hora de llegada, llame al [phone] entre la 1 p. m. y las 3 p. m. el mircoles 28/5/25. Si su hora de llegada es a las 6:00 a. m., no llegue antes, ya que las puertas de [place] del [address] no abren [employer] 6:00 a. m.  RECUERDE: El incumplimiento de las instrucciones puede resultar en un riesgo mdico grave, que puede incluir la [place]; o, a discrecin de su cirujano y Scientific laboratory technician, su ciruga podra tener que reprogramarse.  No consuma alimentos despus de la medianoche anterior a la ciruga.  No mastique chicle ni caramelos duros.  Sin embargo, puede beber lquidos claros hasta 2 horas antes de su hora de llegada programada para la [place]. No beba nada dentro de las 2 horas previas a su hora de llegada programada. Los lquidos claros incluyen: - Agua -  Jugo de manzana sin pulpa - Gatorade (sin colorantes ROJOS) - Caf o t negro (NO agregue leche ni cremas al caf o t). NO beba nada que no est en esta lista.  Adems, su mdico le ha indicado que beba la bebida de carbohidratos preoperatoria Ensure Clear Carbohidratos. Beber esta bebida de carbohidratos [employer] horas antes de la [place] a reducir la resistencia a la insulina y a [employer] del [place]. Por favor, deje de beberla 2 horas antes de la hora de llegada programada.  Una semana antes de la ciruga: Suspenda AHORA (22/12/2023). Suspenda los antiinflamatorios (AINE) como Advil , Aleve, ibuprofeno, Motrin , naproxeno, Naproxeno y productos a base de aspirina como Excedrin, Goody's Powder y BC Powder. Suspenda cualquier suplemento de venta libre hasta despus de la ciruga.  Sin embargo, puede Educational psychologist tomando Tylenol  si lo necesita para Marketing executive de la [place].  Contine tomando todos sus dems medicamentos recetados hasta el da de la ciruga.  EL DA DE LA CIRUGA, TOME ESTOS MEDICAMENTOS SOLO CON BOSOS DE AGUA: -Levotiroxina (SYNTHROID )  No consuma alcohol durante las 24 horas  previas o posteriores a la [place].  No fume, incluidos los cigarrillos electrnicos, durante las 24 horas previas a la [place].  No consuma tabaco masticable durante al menos 6 horas previas a la [place].  No use parches de nicotina el da de la [place].  No consuma drogas recreativas durante al menos una semana (preferiblemente 2 semanas) antes de la ciruga.  Tenga en cuenta que la combinacin de cocana y anestesia puede tener consecuencias negativas, incluso la [place]. Si da positivo en la prueba de cocana, se cancelar la ciruga.  La maana de la ciruga, cepllese los dientes con pasta dental y agua; puede enjuagarse la boca con enjuague bucal si lo desea. No ingiera la pasta dental ni el enjuague bucal.  Use jabn CHG segn las instrucciones.  No use  joyas, maquillaje, horquillas, broches ni esmalte de uas.  Para joyas soldadas (permanentes): pulseras, tobilleras, fajas, etc., quteselas antes de la ciruga. Si no se las [place], es posible que el personal del hospital tenga que cortarlas el da de la [place].  No use lociones, polvos ni perfumes.  No se afeite el vello corporal del cuello para abajo 48 horas antes de la [place].  No puede usar lentes de contacto, audfonos ni dentaduras postizas durante la [place].  No traiga objetos de valor al hospital. [facility] no se hace responsable de la prdida de pertenencias u objetos de valor.  Informe a su mdico si observa algn cambio en su estado de salud (resfriado, fiebre, infeccin). Lleve ropa cmoda (especfica para el tipo de [place]) al hospital.  Despus de la [place], puede ayudar a prevenir complicaciones pulmonares haciendo ejercicios de respiracin. Respire profundamente y tosa cada 1 o 2 horas. Su mdico podra recetarle un dispositivo llamado espirmetro incentivador para ayudarle a respirar profundamente. Al toser o estornudar, sostenga una almohada firmemente contra la incisin con ambas manos. Esto se llama "entablillado". Esto ayuda a Engineer, drilling incisin y tambin disminuye las molestias abdominales.  Si va a pasar la noche en el hospital, deje su maleta en el auto. Despus de la [place], es posible que se la lleven a su habitacin.  En caso de un aumento en el nmero de [place], podra ser necesario que usted, [place], contine su atencin posoperatoria en el departamento de Ciruga Ambulatoria.  Si le dan de alta el da de la ciruga, no se le permitir conducir a casa. Necesitar una persona responsable que lo lleve a casa y lo acompae durante 24 horas despus de la [place].  Si utiliza transporte pblico, deber estar acompaado por una persona responsable.  Si tiene [employer], llame al [employer] de  Preadmisin al [phone].  Poltica de visitas a cirugas:  Los [employer] se sometan a [place] o procedimiento pueden [employer] visitas.  Los [employer] de 16 aos deben estar acompaados por un adulto que no sea Occidental.

## 2024-01-10 NOTE — Patient Instructions (Signed)
 Preparing for Surgery with CHLORHEXIDINE GLUCONATE (CHG) Soap  Chlorhexidine Gluconate (CHG) Soap  o An antiseptic cleaner that kills germs and bonds with the skin to continue killing germs even after washing  o Used for showering the night before surgery and morning of surgery  Before surgery, you can play an important role by reducing the number of germs on your skin.  CHG (Chlorhexidine gluconate) soap is an antiseptic cleanser which kills germs and bonds with the skin to continue killing germs even after washing.  Please do not use if you have an allergy to CHG or antibacterial soaps. If your skin becomes reddened/irritated stop using the CHG.  1. Shower the NIGHT BEFORE SURGERY and the MORNING OF SURGERY with CHG soap.  2. If you choose to wash your hair, wash your hair first as usual with your normal shampoo.  3. After shampooing, rinse your hair and body thoroughly to remove the shampoo.  4. Use CHG as you would any other liquid soap. You can apply CHG directly to the skin and wash gently with a scrungie or a clean washcloth.  5. Apply the CHG soap to your body only from the neck down. Do not use on open wounds or open sores. Avoid contact with your eyes, ears, mouth, and genitals (private parts). Wash face and genitals (private parts) with your normal soap.  6. Wash thoroughly, paying special attention to the area where your surgery will be performed.  7. Thoroughly rinse your body with warm water.  8. Do not shower/wash with your normal soap after using and rinsing off the CHG soap.  9. Pat yourself dry with a clean towel.  10. Wear clean pajamas to bed the night before surgery.  12. Place clean sheets on your bed the night of your first shower and do not sleep with pets.  13. Shower again with the CHG soap on the day of surgery prior to arriving at the hospital.  14. Do not apply any deodorants/lotions/powders.  15. Please wear clean clothes to the  hospital.  Preparacin para la Ciruga con Jabn de Gluconato de Clorhexidina (CHG)  Jabn de Gluconato de Clorhexidina (CHG)  o Un limpiador antisptico que elimina los grmenes y se adhiere a la piel para continuar eliminndolos incluso despus del lavado.  o Se usa  para ducharse la noche anterior y la maana siguiente a la [place].  Antes de la [place], usted puede contribuir significativamente a reducir la cantidad de grmenes en su piel.  El jabn de Gluconato de Clorhexidina (CHG) es un limpiador antisptico que elimina los grmenes y se adhiere a la piel para continuar eliminndolos incluso despus del lavado.  No lo use si tiene alergia al [employer] de Clorhexidina o a los jabones antibacterianos. Si su piel se enrojece o se irrita, suspenda el uso del Gluconato de [place].  1. Dchese la noche anterior a la ciruga y la maana siguiente a la ciruga con jabn de Gluconato de Media planner.  2. Si decide lavarse el cabello, lvelo primero como de costumbre con su champ habitual.  3. Despus de lavarse con champ, enjuague bien el cabello y el cuerpo para Risk manager.  4. Use el CHG como cualquier otro jabn lquido. Puede aplicar el CHG directamente sobre la piel y lavarse suavemente con una toallita o una toalla limpia.  5. Aplique el jabn CHG en el cuerpo solo del cuello para abajo. No lo use sobre heridas o Advertising copywriter. Evite el contacto con los ojos,  odos, boca y genitales (partes ntimas). Lave la cara y los genitales (partes ntimas) con su jabn habitual.  6. Lvese bien, prestando especial atencin a la zona donde se Retail buyer.  7. Enjuague bien el cuerpo con agua tibia.  8. No se duche ni se lave con su jabn habitual despus de usar y enjuagar el jabn CHG.  9. Squese con palmaditas suaves con una toalla limpia.  10. Use pijama limpio para dormir la noche anterior a la ciruga.  12. Coloque sbanas limpias en su cama la noche de su  primera ducha y no duerma con mascotas.  13. Vuelva a ducharse con jabn CHG el da de la ciruga antes de llegar al hospital.  14. No se aplique desodorante, locin ni polvos.  15. Por favor, use ropa limpia para ir al hospital.

## 2024-01-15 ENCOUNTER — Encounter
Admission: RE | Admit: 2024-01-15 | Discharge: 2024-01-15 | Discharge status: Home / Self Care | Source: Ambulatory Visit | Attending: Obstetrics and Gynecology

## 2024-01-15 DIAGNOSIS — Z79890 Hormone replacement therapy (postmenopausal): Secondary | ICD-10-CM | POA: Diagnosis not present

## 2024-01-15 DIAGNOSIS — N9489 Other specified conditions associated with female genital organs and menstrual cycle: Secondary | ICD-10-CM | POA: Diagnosis present

## 2024-01-15 DIAGNOSIS — D270 Benign neoplasm of right ovary: Secondary | ICD-10-CM | POA: Diagnosis not present

## 2024-01-15 DIAGNOSIS — Z01812 Encounter for preprocedural laboratory examination: Secondary | ICD-10-CM | POA: Insufficient documentation

## 2024-01-15 DIAGNOSIS — F419 Anxiety disorder, unspecified: Secondary | ICD-10-CM | POA: Diagnosis not present

## 2024-01-15 DIAGNOSIS — N838 Other noninflammatory disorders of ovary, fallopian tube and broad ligament: Secondary | ICD-10-CM | POA: Diagnosis not present

## 2024-01-15 DIAGNOSIS — E890 Postprocedural hypothyroidism: Secondary | ICD-10-CM | POA: Diagnosis not present

## 2024-01-15 DIAGNOSIS — Z01818 Encounter for other preprocedural examination: Secondary | ICD-10-CM

## 2024-01-15 DIAGNOSIS — I10 Essential (primary) hypertension: Secondary | ICD-10-CM | POA: Diagnosis not present

## 2024-01-15 LAB — CBC
HCT: 40.4 % (ref 36.0–46.0)
Hemoglobin: 13.4 g/dL (ref 12.0–15.0)
MCH: 28.8 pg (ref 26.0–34.0)
MCHC: 33.2 g/dL (ref 30.0–36.0)
MCV: 86.9 fL (ref 80.0–100.0)
Platelets: 275 10*3/uL (ref 150–400)
RBC: 4.65 MIL/uL (ref 3.87–5.11)
RDW: 12.9 % (ref 11.5–15.5)
WBC: 6.9 10*3/uL (ref 4.0–10.5)
nRBC: 0 % (ref 0.0–0.2)

## 2024-01-15 LAB — TYPE AND SCREEN
ABO/RH(D): O POS
Antibody Screen: NEGATIVE

## 2024-01-17 ENCOUNTER — Encounter
Admission: RE | Disposition: A | Discharge status: Home / Self Care | Payer: Self-pay | Source: Home / Self Care | Attending: Obstetrics and Gynecology

## 2024-01-17 ENCOUNTER — Other Ambulatory Visit: Payer: Self-pay

## 2024-01-17 ENCOUNTER — Ambulatory Visit: Payer: Self-pay | Admitting: Urgent Care

## 2024-01-17 ENCOUNTER — Encounter: Payer: Self-pay | Admitting: Obstetrics and Gynecology

## 2024-01-17 ENCOUNTER — Ambulatory Visit
Admission: RE | Admit: 2024-01-17 | Discharge: 2024-01-17 | Disposition: A | Discharge status: Home / Self Care | Attending: Obstetrics and Gynecology | Admitting: Obstetrics and Gynecology

## 2024-01-17 ENCOUNTER — Ambulatory Visit: Admitting: Anesthesiology

## 2024-01-17 DIAGNOSIS — F419 Anxiety disorder, unspecified: Secondary | ICD-10-CM | POA: Insufficient documentation

## 2024-01-17 DIAGNOSIS — Z01818 Encounter for other preprocedural examination: Secondary | ICD-10-CM

## 2024-01-17 DIAGNOSIS — D270 Benign neoplasm of right ovary: Secondary | ICD-10-CM | POA: Diagnosis not present

## 2024-01-17 DIAGNOSIS — I10 Essential (primary) hypertension: Secondary | ICD-10-CM | POA: Insufficient documentation

## 2024-01-17 DIAGNOSIS — N838 Other noninflammatory disorders of ovary, fallopian tube and broad ligament: Secondary | ICD-10-CM | POA: Insufficient documentation

## 2024-01-17 DIAGNOSIS — E890 Postprocedural hypothyroidism: Secondary | ICD-10-CM | POA: Insufficient documentation

## 2024-01-17 DIAGNOSIS — Z79890 Hormone replacement therapy (postmenopausal): Secondary | ICD-10-CM | POA: Insufficient documentation

## 2024-01-17 HISTORY — PX: LAPAROSCOPY: SHX197

## 2024-01-17 HISTORY — PX: LAPAROSCOPIC OVARIAN CYSTECTOMY: SHX6248

## 2024-01-17 LAB — POCT PREGNANCY, URINE: Preg Test, Ur: NEGATIVE

## 2024-01-17 SURGERY — EXCISION, CYST, OVARY, LAPAROSCOPIC
Anesthesia: General | Site: Pelvis | Laterality: Right

## 2024-01-17 MED ORDER — SILVER NITRATE-POT NITRATE 75-25 % EX MISC
CUTANEOUS | Status: AC
  Filled 2024-01-17: qty 10

## 2024-01-17 MED ORDER — BUPIVACAINE HCL 0.5 % IJ SOLN
INTRAMUSCULAR | Status: DC | PRN
  Administered 2024-01-17: 14 mL

## 2024-01-17 MED ORDER — POLYETHYLENE GLYCOL 3350 17 GM/SCOOP PO POWD: 17.0000 g | Freq: Every day | ORAL | Status: AC

## 2024-01-17 MED ORDER — FENTANYL CITRATE (PF) 100 MCG/2ML IJ SOLN
INTRAMUSCULAR | Status: AC
  Filled 2024-01-17: qty 2

## 2024-01-17 MED ORDER — FENTANYL CITRATE (PF) 100 MCG/2ML IJ SOLN
INTRAMUSCULAR | Status: DC | PRN
  Administered 2024-01-17: 50 ug via INTRAVENOUS
  Administered 2024-01-17 (×2): 25 ug via INTRAVENOUS

## 2024-01-17 MED ORDER — CELECOXIB 200 MG PO CAPS
ORAL_CAPSULE | ORAL | Status: AC
  Filled 2024-01-17: qty 1

## 2024-01-17 MED ORDER — OXYCODONE HCL 5 MG PO TABS
5.0000 mg | ORAL_TABLET | Freq: Once | ORAL | Status: AC | PRN
  Administered 2024-01-17: 5 mg via ORAL

## 2024-01-17 MED ORDER — DEXAMETHASONE SODIUM PHOSPHATE 10 MG/ML IJ SOLN
INTRAMUSCULAR | Status: DC | PRN
  Administered 2024-01-17: 8 mg via INTRAVENOUS

## 2024-01-17 MED ORDER — LACTATED RINGERS IV SOLN: INTRAVENOUS | Status: DC | PRN

## 2024-01-17 MED ORDER — DEXMEDETOMIDINE HCL IN NACL 80 MCG/20ML IV SOLN
INTRAVENOUS | Status: DC | PRN
  Administered 2024-01-17: 4 ug via INTRAVENOUS

## 2024-01-17 MED ORDER — GABAPENTIN 300 MG PO CAPS: 300.0000 mg | ORAL_CAPSULE | Freq: Once | ORAL | Status: DC

## 2024-01-17 MED ORDER — PROPOFOL 10 MG/ML IV BOLUS
INTRAVENOUS | Status: DC | PRN
Start: 2024-01-17 — End: 2024-01-17
  Administered 2024-01-17: 150 mg via INTRAVENOUS

## 2024-01-17 MED ORDER — ACETAMINOPHEN 500 MG PO TABS
ORAL_TABLET | ORAL | Status: AC
  Filled 2024-01-17: qty 2

## 2024-01-17 MED ORDER — PHENYLEPHRINE 80 MCG/ML (10ML) SYRINGE FOR IV PUSH (FOR BLOOD PRESSURE SUPPORT)
PREFILLED_SYRINGE | INTRAVENOUS | Status: DC | PRN
  Administered 2024-01-17 (×7): 80 ug via INTRAVENOUS
  Administered 2024-01-17: 160 ug via INTRAVENOUS
  Administered 2024-01-17 (×2): 80 ug via INTRAVENOUS

## 2024-01-17 MED ORDER — ROCURONIUM BROMIDE 100 MG/10ML IV SOLN
INTRAVENOUS | Status: DC | PRN
  Administered 2024-01-17: 30 mg via INTRAVENOUS
  Administered 2024-01-17: 40 mg via INTRAVENOUS
  Administered 2024-01-17: 30 mg via INTRAVENOUS

## 2024-01-17 MED ORDER — CELECOXIB 200 MG PO CAPS
200.0000 mg | ORAL_CAPSULE | Freq: Once | ORAL | Status: AC
  Administered 2024-01-17: 200 mg via ORAL

## 2024-01-17 MED ORDER — ESMOLOL HCL 100 MG/10ML IV SOLN
INTRAVENOUS | Status: AC
  Filled 2024-01-17: qty 10

## 2024-01-17 MED ORDER — CHLORHEXIDINE GLUCONATE 0.12 % MT SOLN
15.0000 mL | Freq: Once | OROMUCOSAL | Status: AC
  Administered 2024-01-17: 15 mL via OROMUCOSAL

## 2024-01-17 MED ORDER — CHLORHEXIDINE GLUCONATE 0.12 % MT SOLN
OROMUCOSAL | Status: AC
Start: 2024-01-17 — End: ?
  Filled 2024-01-17: qty 15

## 2024-01-17 MED ORDER — OXYCODONE HCL 5 MG/5ML PO SOLN: 5.0000 mg | Freq: Once | ORAL | Status: AC | PRN

## 2024-01-17 MED ORDER — POVIDONE-IODINE 10 % EX SWAB
2.0000 | Freq: Once | CUTANEOUS | Status: AC
  Administered 2024-01-17: 2 via TOPICAL

## 2024-01-17 MED ORDER — ONDANSETRON HCL 4 MG/2ML IJ SOLN
INTRAMUSCULAR | Status: AC
  Filled 2024-01-17: qty 2

## 2024-01-17 MED ORDER — LIDOCAINE HCL (CARDIAC) PF 100 MG/5ML IV SOSY
PREFILLED_SYRINGE | INTRAVENOUS | Status: DC | PRN
  Administered 2024-01-17: 60 mg via INTRAVENOUS

## 2024-01-17 MED ORDER — ESMOLOL HCL 100 MG/10ML IV SOLN
INTRAVENOUS | Status: DC | PRN
  Administered 2024-01-17: 5 mg via INTRAVENOUS

## 2024-01-17 MED ORDER — ORAL CARE MOUTH RINSE: 15.0000 mL | Freq: Once | OROMUCOSAL | Status: AC

## 2024-01-17 MED ORDER — PHENYLEPHRINE HCL-NACL 20-0.9 MG/250ML-% IV SOLN
INTRAVENOUS | Status: AC
  Filled 2024-01-17: qty 250

## 2024-01-17 MED ORDER — LACTATED RINGERS IV SOLN: INTRAVENOUS | Status: DC

## 2024-01-17 MED ORDER — GLYCOPYRROLATE 0.2 MG/ML IJ SOLN
INTRAMUSCULAR | Status: DC | PRN
  Administered 2024-01-17: .2 mg via INTRAVENOUS

## 2024-01-17 MED ORDER — OXYCODONE HCL 5 MG PO TABS
ORAL_TABLET | ORAL | Status: AC
  Filled 2024-01-17: qty 1

## 2024-01-17 MED ORDER — ACETAMINOPHEN 500 MG PO TABS: 1000.0000 mg | ORAL_TABLET | Freq: Once | ORAL | Status: DC

## 2024-01-17 MED ORDER — IBUPROFEN 800 MG PO TABS: 800.0000 mg | ORAL_TABLET | Freq: Three times a day (TID) | ORAL | Status: AC | PRN

## 2024-01-17 MED ORDER — ACETAMINOPHEN 500 MG PO TABS: 1000.0000 mg | ORAL_TABLET | Freq: Three times a day (TID) | ORAL | Status: AC | PRN

## 2024-01-17 MED ORDER — ONDANSETRON HCL 4 MG/2ML IJ SOLN
INTRAMUSCULAR | Status: DC | PRN
  Administered 2024-01-17: 4 mg via INTRAVENOUS

## 2024-01-17 MED ORDER — GABAPENTIN 300 MG PO CAPS
300.0000 mg | ORAL_CAPSULE | ORAL | Status: AC
  Administered 2024-01-17: 300 mg via ORAL

## 2024-01-17 MED ORDER — EPHEDRINE SULFATE-NACL 50-0.9 MG/10ML-% IV SOSY
PREFILLED_SYRINGE | INTRAVENOUS | Status: DC | PRN
  Administered 2024-01-17: 5 mg via INTRAVENOUS
  Administered 2024-01-17: 10 mg via INTRAVENOUS

## 2024-01-17 MED ORDER — ONDANSETRON HCL 4 MG PO TABS: 4.0000 mg | ORAL_TABLET | Freq: Three times a day (TID) | ORAL | Status: AC | PRN

## 2024-01-17 MED ORDER — SODIUM CHLORIDE (PF) 0.9 % IJ SOLN
INTRAMUSCULAR | Status: AC
  Filled 2024-01-17: qty 10

## 2024-01-17 MED ORDER — ROCURONIUM BROMIDE 10 MG/ML (PF) SYRINGE
PREFILLED_SYRINGE | INTRAVENOUS | Status: AC
Start: 2024-01-17 — End: ?
  Filled 2024-01-17: qty 10

## 2024-01-17 MED ORDER — OXYCODONE HCL 5 MG PO TABS: 5.0000 mg | ORAL_TABLET | Freq: Four times a day (QID) | ORAL | Status: AC | PRN

## 2024-01-17 MED ORDER — MIDAZOLAM HCL 2 MG/2ML IJ SOLN
INTRAMUSCULAR | Status: DC | PRN
  Administered 2024-01-17: 2 mg via INTRAVENOUS

## 2024-01-17 MED ORDER — DEXAMETHASONE SODIUM PHOSPHATE 10 MG/ML IJ SOLN
INTRAMUSCULAR | Status: AC
  Filled 2024-01-17: qty 1

## 2024-01-17 MED ORDER — ACETAMINOPHEN 500 MG PO TABS
1000.0000 mg | ORAL_TABLET | ORAL | Status: AC
  Administered 2024-01-17: 1000 mg via ORAL

## 2024-01-17 MED ORDER — MIDAZOLAM HCL 2 MG/2ML IJ SOLN
INTRAMUSCULAR | Status: AC
  Filled 2024-01-17: qty 2

## 2024-01-17 MED ORDER — SODIUM CHLORIDE 0.9 % IV SOLN: INTRAVENOUS | Status: DC

## 2024-01-17 MED ORDER — FENTANYL CITRATE (PF) 100 MCG/2ML IJ SOLN
25.0000 ug | INTRAMUSCULAR | Status: DC | PRN
  Administered 2024-01-17 (×2): 50 ug via INTRAVENOUS

## 2024-01-17 MED ORDER — PROPOFOL 10 MG/ML IV BOLUS
INTRAVENOUS | Status: AC
  Filled 2024-01-17: qty 20

## 2024-01-17 MED ORDER — FENTANYL CITRATE (PF) 100 MCG/2ML IJ SOLN
INTRAMUSCULAR | Status: AC
Start: 2024-01-17 — End: ?
  Filled 2024-01-17: qty 2

## 2024-01-17 MED ORDER — SODIUM CHLORIDE 0.9 % IR SOLN
Status: DC | PRN
  Administered 2024-01-17: 200 mL

## 2024-01-17 MED ORDER — SUGAMMADEX SODIUM 200 MG/2ML IV SOLN
INTRAVENOUS | Status: DC | PRN
  Administered 2024-01-17: 200 mg via INTRAVENOUS

## 2024-01-17 MED ORDER — GABAPENTIN 300 MG PO CAPS
ORAL_CAPSULE | ORAL | Status: AC
  Filled 2024-01-17: qty 1

## 2024-01-17 MED ORDER — BUPIVACAINE HCL (PF) 0.5 % IJ SOLN
INTRAMUSCULAR | Status: AC
  Filled 2024-01-17: qty 30

## 2024-01-17 MED ORDER — LIDOCAINE HCL (PF) 2 % IJ SOLN
INTRAMUSCULAR | Status: AC
Start: 2024-01-17 — End: ?
  Filled 2024-01-17: qty 5

## 2024-01-17 SURGICAL SUPPLY — 38 items
BAG LAPAROSCOPIC 12 15 PORT 16 (BASKET) IMPLANT
BAG URINE DRAIN 2000ML AR STRL (UROLOGICAL SUPPLIES) IMPLANT
BLADE SURG SZ11 CARB STEEL (BLADE) ×2 IMPLANT
CABLE HIGH FREQUENCY MONO STRZ (ELECTRODE) IMPLANT
CATH URTH 16FR FL 2W BLN LF (CATHETERS) ×2 IMPLANT
DRSG TEGADERM 2-3/8X2-3/4 SM (GAUZE/BANDAGES/DRESSINGS) ×2 IMPLANT
GAUZE 4X4 16PLY ~~LOC~~+RFID DBL (SPONGE) ×2 IMPLANT
GAUZE SPONGE 2X2 STRL 8-PLY (GAUZE/BANDAGES/DRESSINGS) ×2 IMPLANT
GLOVE SURG SYN 6.5 ES PF (GLOVE) ×6 IMPLANT
GLOVE SURG SYN 6.5 PF PI (GLOVE) ×6 IMPLANT
GOWN STRL REUS W/ TWL LRG LVL3 (GOWN DISPOSABLE) ×4 IMPLANT
GRASPER SUT TROCAR 14GX15 (MISCELLANEOUS) IMPLANT
IRRIGATION STRYKERFLOW (MISCELLANEOUS) IMPLANT
IV NS 1000ML BAXH (IV SOLUTION) ×2 IMPLANT
KIT PINK PAD W/HEAD ARE REST (MISCELLANEOUS) ×2 IMPLANT
KIT PINK PAD W/HEAD ARM REST (MISCELLANEOUS) ×2 IMPLANT
KIT TURNOVER CYSTO (KITS) ×2 IMPLANT
KITTNER LAPARASCOPIC 5X40 (MISCELLANEOUS) IMPLANT
LABEL OR SOLS (LABEL) ×2 IMPLANT
LIGASURE LAP MARYLAND 5MM 37CM (ELECTROSURGICAL) IMPLANT
MANIFOLD NEPTUNE II (INSTRUMENTS) ×2 IMPLANT
MANIPULATOR UTERINE 4.5 ZUMI (MISCELLANEOUS) IMPLANT
NS IRRIG 500ML POUR BTL (IV SOLUTION) ×2 IMPLANT
PACK GYN LAPAROSCOPIC (MISCELLANEOUS) ×2 IMPLANT
SCISSORS METZENBAUM CVD 33 (INSTRUMENTS) IMPLANT
SCRUB CHG 4% DYNA-HEX 4OZ (MISCELLANEOUS) ×2 IMPLANT
SET TUBE SMOKE EVAC HIGH FLOW (TUBING) ×2 IMPLANT
SHEARS HARMONIC 36 ACE (MISCELLANEOUS) IMPLANT
SLEEVE Z-THREAD 5X100MM (TROCAR) ×2 IMPLANT
STRIP CLOSURE SKIN 1/4X4 (GAUZE/BANDAGES/DRESSINGS) ×2 IMPLANT
SUT VIC AB 0 CT1 36 (SUTURE) ×2 IMPLANT
SUT VIC AB 2-0 UR6 27 (SUTURE) IMPLANT
SUT VICRYL 0 UR6 27IN ABS (SUTURE) IMPLANT
SUTURE MNCRL 4-0 27XMF (SUTURE) ×2 IMPLANT
SYR 50ML LL SCALE MARK (SYRINGE) IMPLANT
SYSTEM BAG RETRIEVAL 10MM (BASKET) IMPLANT
TROCAR Z-THRD FIOS HNDL 11X100 (TROCAR) ×2 IMPLANT
TROCAR Z-THREAD FIOS 5X100MM (TROCAR) ×2 IMPLANT

## 2024-01-17 NOTE — Anesthesia Procedure Notes (Signed)
 Procedure Name: Intubation Date/Time: 01/17/2024 7:45 AM  Performed by: Philippe Brazen, CRNAPre-anesthesia Checklist: Patient identified, Emergency Drugs available, Suction available and Patient being monitored Patient Re-evaluated:Patient Re-evaluated prior to induction Oxygen Delivery Method: Circle system utilized Preoxygenation: Pre-oxygenation with 100% oxygen Induction Type: IV induction Ventilation: Mask ventilation without difficulty Laryngoscope Size: McGrath and 3 Grade View: Grade I Tube type: Oral Tube size: 7.0 mm Number of attempts: 1 Airway Equipment and Method: Stylet and Oral airway Placement Confirmation: ETT inserted through vocal cords under direct vision, positive ETCO2 and breath sounds checked- equal and bilateral Secured at: 21 cm Tube secured with: Tape Dental Injury: Teeth and Oropharynx as per pre-operative assessment

## 2024-01-17 NOTE — Interval H&P Note (Signed)
 History and Physical Interval Note:  01/17/2024 7:26 AM  Hannah Maldonado  has presented today for surgery, with the diagnosis of adnexal mass.  The various methods of treatment have been discussed with the patient and family. After consideration of risks, benefits and other options for treatment, the patient has consented to  Procedure(s) with comments: EXCISION, CYST, OVARY, LAPAROSCOPIC (N/A) - DIAGNOSTIC LAPAROSCOPY, REMOVAL OF PELVIC MASS as a surgical intervention.  The patient's history has been reviewed, patient examined, no change in status, stable for surgery.  I have reviewed the patient's chart and labs.  Questions were answered to the patient's satisfaction.     Tilmon Wisehart V Jen Eppinger

## 2024-01-17 NOTE — Op Note (Signed)
 Operative Note   DATE OF SERVICE: 01/17/2024 PATIENT NAME: Hannah Maldonado MRN: [id number] DATE OF BIRTH: 05-27-1986   PREOPERATIVE DIAGNOSIS: Right adnexal masses POSTOPERATIVE DIAGNOSIS: Right para-ovarian cyst and right broad ligament cyst OPERATION: Diagnostic laparoscopy, removal of right para-ovarian cyst, removal of right broad ligament cyst   SURGEON: Everlena Hoard, MD ASSISTANT: Jeani Mill, MD   ANESTHESIA: GETA   COMPLICATIONS: None.   IVF: 700cc UOP: 75cc EBL: 25cc   OPERATIVE FINDINGS: Engorged pelvic vasculature bilaterally. 7 cm right para-ovarian simple cyst- cyst off lateral side near IP ligament. Right ovary elongated with ~1cm simple appearing cyst but otherwise normal appearing. Clear fluid drained from cyst. 4 cm right simple cyst in the side wall-broad ligament which was dissected out and drained- clear fluid. After the dissection, a window remained in the right broad ligament. Due to the potential for causing an internal hernia, the right utero-ovarian ligament was transected to resolve the window. Slightly enlarged uterus with evidence of prior salpingectomy but otherwise normal appearing.  Liver edge, omentum, peritoneum, and bowel normal to the extent seen.   PROCEDURE: After informed consent was obtained, the patient was taken to the operating room where she was placed supine on the operating table. General anesthesia was induced. She was placed in modified lithotomy position with Allen stirrups. Exam under anesthesia was performed. The patient was prepped and draped in the usual sterile fashion. A time out was performed.    Foley catheter was placed to drain the bladder. A speculum was placed and the cervix visualized. A tenaculum was placed on the cervix and the cervix was dilated to accommodate a Surveyor, mining. This was placed on the cervix and all other vaginal instruments were removed without difficulty. Surgeon's gloves were changed and  attention was turned to the abdomen.    Local anesthetic (55 cc of 0.5% marcaine ) was injected at intended infra-umbilical incision site . An infra-umbilical incision was made and a 5mm trocar was placed under direct visualization. The abdomen was then insuflated with CO2 gas to a pressure of 15 mmHg. Initial survey of the abdomen revealed findings as above.  A 5 mm port was placed in the RLQ and a 11 mm port was placed in the LLQ.    The right para-ovarian cyst was transected using the Ligasure with care to avoid the IP ligament. Attention turned to the right side wall cyst. The right pelvic sidewall was opened with monopolar scissors. A combination of blunt and sharp dissection allowed the cyst to be dissected out. Monopolar cautery was used to cauterize any vessels. The cyst was removed from the side wall intact. Visualization of the ureter and IP ligament was maintained during the entire dissection.  Both cyst were punctured using laparoscopic scissors and drained noting clear fluid. The cyst walls were removed through the abdomen through the 11mm port.  A window was noted in the right broad ligament due to the dissection. The decision was made to transect the right utero-ovarian ligament to resolve the internal window which could have the potential to cause an internal hernia. The right utero-ovarian ligament was transected using the Ligasure. Hemostasis noted. The ovary was watched and remained well vascularized.    The abdomen was then irrigated with and hemostasis was again noted. The pressure was decreased to 5 mmHg and hemostasis was noted.    The trocars were removed using a Q tip and the pneumoperitoneum was deflated. The skin incisions were reapproximated with 4-0 monocryl. Tegaderm dressings were placed.  Patient tolerated the procedure well. Instrument counts were correct x 2. Patient was awake and brought to the recovery room in stable condition. The specimen was sent to pathology.     Vita Grip, MD, 01/17/2024, 9:34 AM

## 2024-01-17 NOTE — Anesthesia Postprocedure Evaluation (Signed)
 Anesthesia Post Note  Patient: Hannah Maldonado  Procedure(s) Performed: LAPAROSCOPIC PARAOVARIAN AND RIGHT BROAD LIGAMENT CYSTS REMOVAL (Right: Pelvis) LAPAROSCOPY, DIAGNOSTIC (Abdomen)  Patient location during evaluation: PACU Anesthesia Type: General Level of consciousness: awake and alert Pain management: pain level controlled Vital Signs Assessment: post-procedure vital signs reviewed and stable Respiratory status: spontaneous breathing, nonlabored ventilation, respiratory function stable and patient connected to nasal cannula oxygen Cardiovascular status: blood pressure returned to baseline and stable Postop Assessment: no apparent nausea or vomiting Anesthetic complications: no  No notable events documented.   Last Vitals:  Vitals:   01/17/24 1100 01/17/24 1115  BP: (!) 99/56 (!) 140/90  Pulse: 86 73  Resp: 20 20  Temp: 36.7 C 36.6 C  SpO2: 99% 100%    Last Pain:  Vitals:   01/17/24 1115  TempSrc: Temporal  PainSc: 0-No pain                 Enrique Harvest

## 2024-01-17 NOTE — Anesthesia Preprocedure Evaluation (Signed)
 Anesthesia Evaluation  Patient identified by MRN, date of birth, ID band Patient awake    Reviewed: Allergy & Precautions, H&P , NPO status , Patient's Chart, lab work & pertinent test results  Airway Mallampati: II  TM Distance: >3 FB Neck ROM: full    Dental no notable dental hx.    Pulmonary neg pulmonary ROS   Pulmonary exam normal        Cardiovascular negative cardio ROS Normal cardiovascular exam     Neuro/Psych  PSYCHIATRIC DISORDERS Anxiety     negative neurological ROS     GI/Hepatic negative GI ROS, Neg liver ROS,,,  Endo/Other    Renal/GU      Musculoskeletal   Abdominal   Peds  Hematology negative hematology ROS (+)   Anesthesia Other Findings Past Medical History: No date: Adnexal mass No date: Anxiety No date: Depression No date: Gestational diabetes No date: Hypothyroidism No date: Thyroid disease  Past Surgical History: 03/19/2020: CHOLECYSTECTOMY No date: THYROIDECTOMY 09/01/2022: TUBAL LIGATION; Bilateral     Comment:  Procedure: POST PARTUM TUBAL LIGATION;  Surgeon:               Prescilla Brod, MD;  Location: [facility] ORS;  Service:               Gynecology;  Laterality: Bilateral;     Reproductive/Obstetrics negative OB ROS                              Anesthesia Physical Anesthesia Plan  ASA: 2  Anesthesia Plan: General ETT   Post-op Pain Management:    Induction:   PONV Risk Score and Plan: 2 and Ondansetron , Dexamethasone  and Midazolam   Airway Management Planned: Oral ETT  Additional Equipment:   Intra-op Plan:   Post-operative Plan:   Informed Consent:      Dental Advisory Given  Plan Discussed with: CRNA and Surgeon  Anesthesia Plan Comments:          Anesthesia Quick Evaluation

## 2024-01-17 NOTE — Transfer of Care (Signed)
 Immediate Anesthesia Transfer of Care Note  Patient: Hannah Maldonado  Procedure(s) Performed: LAPAROSCOPIC PARAOVARIAN AND RIGHT BROAD LIGAMENT CYSTS REMOVAL (Right: Pelvis) LAPAROSCOPY, DIAGNOSTIC (Abdomen)  Patient Location: PACU  Anesthesia Type:General  Level of Consciousness: drowsy and patient cooperative  Airway & Oxygen Therapy: Patient Spontanous Breathing and Patient connected to face mask oxygen  Post-op Assessment: Report given to RN and Post -op Vital signs reviewed and stable  Post vital signs: Reviewed and stable  Last Vitals:  Vitals Value Taken Time  BP 102/62 01/17/24 0935  Temp    Pulse 80 01/17/24 0937  Resp 15 01/17/24 0937  SpO2 100 % 01/17/24 0937  Vitals shown include unfiled device data.  Last Pain:  Vitals:   01/17/24 0720  TempSrc:   PainSc: 0-No pain         Complications: No notable events documented.

## 2024-01-18 ENCOUNTER — Encounter: Payer: Self-pay | Admitting: Obstetrics and Gynecology

## 2024-01-18 LAB — SURGICAL PATHOLOGY

## 2024-02-12 ENCOUNTER — Encounter: Payer: Self-pay | Admitting: Obstetrics and Gynecology

## 2024-03-07 ENCOUNTER — Encounter: Payer: Self-pay | Admitting: Obstetrics and Gynecology
